# Patient Record
Sex: Female | Born: 1985 | Race: White | Hispanic: No | Marital: Married | Smoking: Never smoker
Health system: Southern US, Community
[De-identification: ages and names within clinical notes are randomized; demographics above are authoritative.]

## PROBLEM LIST (undated history)

## (undated) ENCOUNTER — Inpatient Hospital Stay (HOSPITAL_COMMUNITY): Payer: Self-pay

## (undated) DIAGNOSIS — Z789 Other specified health status: Secondary | ICD-10-CM

## (undated) HISTORY — PX: ANTERIOR CRUCIATE LIGAMENT REPAIR: SHX115

## (undated) HISTORY — PX: TONSILLECTOMY AND ADENOIDECTOMY: SUR1326

## (undated) HISTORY — PX: WRIST SURGERY: SHX841

## (undated) HISTORY — PX: TYMPANOSTOMY TUBE PLACEMENT: SHX32

---

## 2016-03-03 LAB — OB RESULTS CONSOLE RUBELLA ANTIBODY, IGM: Rubella: IMMUNE

## 2016-03-03 LAB — OB RESULTS CONSOLE HIV ANTIBODY (ROUTINE TESTING): HIV: NONREACTIVE

## 2016-03-03 LAB — OB RESULTS CONSOLE GC/CHLAMYDIA
Chlamydia: NEGATIVE
Gonorrhea: NEGATIVE

## 2016-03-03 LAB — OB RESULTS CONSOLE HEPATITIS B SURFACE ANTIGEN: Hepatitis B Surface Ag: NEGATIVE

## 2016-03-03 LAB — OB RESULTS CONSOLE ABO/RH: RH TYPE: POSITIVE

## 2016-03-03 LAB — OB RESULTS CONSOLE GBS: STREP GROUP B AG: POSITIVE

## 2016-03-03 LAB — OB RESULTS CONSOLE RPR: RPR: NONREACTIVE

## 2016-03-03 LAB — OB RESULTS CONSOLE ANTIBODY SCREEN: Antibody Screen: NEGATIVE

## 2016-05-13 ENCOUNTER — Other Ambulatory Visit: Payer: Self-pay | Admitting: Obstetrics and Gynecology

## 2016-05-21 ENCOUNTER — Other Ambulatory Visit (HOSPITAL_COMMUNITY): Payer: Self-pay | Admitting: Obstetrics and Gynecology

## 2016-05-21 DIAGNOSIS — Z3A21 21 weeks gestation of pregnancy: Secondary | ICD-10-CM

## 2016-05-21 DIAGNOSIS — Z3689 Encounter for other specified antenatal screening: Secondary | ICD-10-CM

## 2016-05-21 DIAGNOSIS — O280 Abnormal hematological finding on antenatal screening of mother: Secondary | ICD-10-CM

## 2016-05-27 ENCOUNTER — Encounter (HOSPITAL_COMMUNITY): Payer: Self-pay | Admitting: *Deleted

## 2016-05-28 ENCOUNTER — Encounter (HOSPITAL_COMMUNITY): Payer: Self-pay

## 2016-05-28 ENCOUNTER — Ambulatory Visit (HOSPITAL_COMMUNITY)
Admission: RE | Admit: 2016-05-28 | Discharge: 2016-05-28 | Disposition: A | Discharge status: Home / Self Care | Payer: Managed Care, Other (non HMO) | Source: Ambulatory Visit | Attending: Obstetrics and Gynecology | Admitting: Obstetrics and Gynecology

## 2016-05-28 ENCOUNTER — Other Ambulatory Visit (HOSPITAL_COMMUNITY): Payer: Self-pay | Admitting: *Deleted

## 2016-05-28 ENCOUNTER — Other Ambulatory Visit: Payer: Self-pay

## 2016-05-28 DIAGNOSIS — Z3A20 20 weeks gestation of pregnancy: Secondary | ICD-10-CM | POA: Insufficient documentation

## 2016-05-28 DIAGNOSIS — Z3A21 21 weeks gestation of pregnancy: Secondary | ICD-10-CM

## 2016-05-28 DIAGNOSIS — O280 Abnormal hematological finding on antenatal screening of mother: Secondary | ICD-10-CM

## 2016-05-28 DIAGNOSIS — Z3689 Encounter for other specified antenatal screening: Secondary | ICD-10-CM

## 2016-05-28 DIAGNOSIS — Z361 Encounter for antenatal screening for raised alphafetoprotein level: Secondary | ICD-10-CM | POA: Insufficient documentation

## 2016-05-28 DIAGNOSIS — Z363 Encounter for antenatal screening for malformations: Secondary | ICD-10-CM | POA: Diagnosis not present

## 2016-05-28 HISTORY — DX: Other specified health status: Z78.9

## 2016-05-28 NOTE — Progress Notes (Signed)
Genetic Counseling  High-Risk Gestation Note  Appointment Date:  05/28/2016 Referred By: Samantha Mora, Richard, MD Date of Birth:  05/31/1985 Partner:  Samantha Mora   Pregnancy History: G1P0 Estimated Date of Delivery: 10/15/16 Estimated Gestational Age: 7011w0d Attending: Alpha GulaPaul Whitecar, MD    Samantha Mora and her husband, Samantha Mora, were seen for consultation for genetic counseling because of an elevated MSAFP of 3.54 MoMs based on maternal serum screening through [facility].    In summary:  Discussed elevated MSAFP (3.54 MoM) and associated 1 in 34 OSBR  Reviewed possible explanations for elevation  Discussed additional options  Ultrasound- within normal limits today  Amniocentesis- declined  Discussed associations with unexplained elevated MSAFP  Follow-up ultrasound scheduled 07/09/16 to reassess fetal growth  Reviewed family history concerns  Discussed carrier screening options - declined  CF  SMA  Hemoglobinopathies  We reviewed [name]' maternal serum screening result, the elevation of MSAFP, and the associated 1 in 34 risk for a fetal open neural tube defect.   We reviewed open neural tube defects including: the typical multifactorial etiology and variable prognosis.  In addition, we discussed alternative explanations for an elevated MSAFP including: normal variation, twins, feto-maternal bleeding, a gestational dating error, abdominal wall defects, kidney differences, oligohydramnios, and placental problems.  We discussed that an unexplained elevation of MSAFP is associated with an increased risk for third trimester complications including: prematurity, low birth weight, and pre-eclampsia.    We reviewed additional available screening and diagnostic options including detailed ultrasound and amniocentesis.  We discussed the risks, limitations, and benefits of each. Detailed ultrasound was performed today. Visualized fetal  anatomy was within normal limits, and normal amniotic fluid volume visualized. Complete ultrasound report under separate cover. After thoughtful consideration of these options, Samantha Mora declined amniocentesis.  She understands that ultrasound cannot rule out all birth defects or genetic syndromes.  However, she was counseled that approximately 90% of fetuses with open neural tube defects can be detected by detailed second trimester ultrasound, when well visualized. Given the apparently unexplained elevated MSAFP, follow-up ultrasound was scheduled in 6 weeks to reassess fetal growth. Samantha Mora planned to discuss with her OB at her next visit whether this follow-up ultrasound could be performed in her OB office.  [name] was provided with written information regarding cystic fibrosis (CF), spinal muscular atrophy (SMA) and hemoglobinopathies including the carrier frequency, availability of carrier screening and prenatal diagnosis if indicated.  In addition, we discussed that CF and hemoglobinopathies are routinely screened for as part of the [place] newborn screening panel. She declined screening for CF, SMA and hemoglobinopathies.  Both family histories were reviewed and found to be noncontributory for birth defects, intellectual disability, and known genetic conditions. Consanguinity was denied. Formal pedigree construction was not performed today due to time constraints. Without further information regarding the provided family history, an accurate genetic risk cannot be calculated. Further genetic counseling is warranted if more information is obtained.  [name] denied exposure to environmental toxins or chemical agents. She denied the use of alcohol, tobacco or street drugs. She denied significant viral illnesses during the course of her pregnancy. Her medical and surgical histories were noncontributory.   I counseled this couple for approximately 25 minutes  regarding the above risks and available options.    Samantha PlowmanKaren Onesha Krebbs, MS,  Certified Genetic Counselor 05/28/2016

## 2016-06-01 ENCOUNTER — Ambulatory Visit (HOSPITAL_COMMUNITY): Payer: Managed Care, Other (non HMO)

## 2016-06-04 ENCOUNTER — Ambulatory Visit (HOSPITAL_COMMUNITY): Payer: Managed Care, Other (non HMO)

## 2016-06-13 ENCOUNTER — Inpatient Hospital Stay (HOSPITAL_COMMUNITY): Payer: Managed Care, Other (non HMO)

## 2016-06-13 ENCOUNTER — Inpatient Hospital Stay (HOSPITAL_COMMUNITY)
Admission: AD | Admit: 2016-06-13 | Discharge: 2016-06-13 | Disposition: A | Discharge status: Home / Self Care | Payer: Managed Care, Other (non HMO) | Source: Ambulatory Visit | Attending: Obstetrics and Gynecology | Admitting: Obstetrics and Gynecology

## 2016-06-13 ENCOUNTER — Encounter (HOSPITAL_COMMUNITY): Payer: Self-pay | Admitting: *Deleted

## 2016-06-13 DIAGNOSIS — Z79899 Other long term (current) drug therapy: Secondary | ICD-10-CM | POA: Insufficient documentation

## 2016-06-13 DIAGNOSIS — W010XXA Fall on same level from slipping, tripping and stumbling without subsequent striking against object, initial encounter: Secondary | ICD-10-CM | POA: Insufficient documentation

## 2016-06-13 DIAGNOSIS — Z9889 Other specified postprocedural states: Secondary | ICD-10-CM | POA: Insufficient documentation

## 2016-06-13 DIAGNOSIS — S8002XA Contusion of left knee, initial encounter: Secondary | ICD-10-CM

## 2016-06-13 DIAGNOSIS — R1032 Left lower quadrant pain: Secondary | ICD-10-CM | POA: Diagnosis not present

## 2016-06-13 DIAGNOSIS — O9A212 Injury, poisoning and certain other consequences of external causes complicating pregnancy, second trimester: Secondary | ICD-10-CM

## 2016-06-13 DIAGNOSIS — R112 Nausea with vomiting, unspecified: Secondary | ICD-10-CM

## 2016-06-13 DIAGNOSIS — W101XXA Fall (on)(from) sidewalk curb, initial encounter: Secondary | ICD-10-CM

## 2016-06-13 DIAGNOSIS — T1490XA Injury, unspecified, initial encounter: Secondary | ICD-10-CM | POA: Insufficient documentation

## 2016-06-13 DIAGNOSIS — S3991XA Unspecified injury of abdomen, initial encounter: Secondary | ICD-10-CM

## 2016-06-13 DIAGNOSIS — O26892 Other specified pregnancy related conditions, second trimester: Secondary | ICD-10-CM | POA: Insufficient documentation

## 2016-06-13 DIAGNOSIS — Z3A22 22 weeks gestation of pregnancy: Secondary | ICD-10-CM | POA: Diagnosis present

## 2016-06-13 LAB — URINALYSIS, ROUTINE W REFLEX MICROSCOPIC
Bilirubin Urine: NEGATIVE
GLUCOSE, UA: NEGATIVE mg/dL
Hgb urine dipstick: NEGATIVE
KETONES UR: NEGATIVE mg/dL
LEUKOCYTES UA: NEGATIVE
NITRITE: NEGATIVE
Protein, ur: NEGATIVE mg/dL
Specific Gravity, Urine: 1.009 (ref 1.005–1.030)
pH: 7 (ref 5.0–8.0)

## 2016-06-13 MED ORDER — ACETAMINOPHEN 500 MG PO TABS
1000.0000 mg | ORAL_TABLET | Freq: Once | ORAL | Status: AC
  Administered 2016-06-13: 1000 mg via ORAL
  Filled 2016-06-13: qty 2

## 2016-06-13 NOTE — MAU Note (Signed)
Pt states she fell last night between 2100 and 2200.  Pt states that she landed on her belly.  Pt states she is feeling the baby move this morning.  Pt states she is having some cramping on the lower left side of her abdomen.  Pt denies any bleeding or leaking.

## 2016-06-13 NOTE — Discharge Instructions (Signed)
What Do I Need to Know About Injuries During Pregnancy? °Trauma is the most common cause of injury and death in pregnant women. This can also result in significant harm or death of the baby. °Your baby is protected in the womb (uterus) by a sac filled with fluid (amniotic sac). Your baby can be harmed if there is direct, high-impact trauma to your abdomen and pelvis. This type of trauma can result in tearing of your uterus, the placenta pulling away from the wall of the uterus (placenta abruption), or the amniotic sac breaking open (rupture of membranes). These injuries can decrease or stop the blood supply to your baby or cause you to go into labor earlier than expected. Minor falls and low-impact automobile accidents do not usually harm your baby, even if they do minimally harm you. °WHAT KIND OF INJURIES CAN AFFECT MY PREGNANCY? °The most common causes of injury or death to a baby include: °· Falls. Falls are more common in the second and third trimester of the pregnancy. Factors that increase your risk of falling include: °¨ Increase in your weight. °¨ The change in your center of gravity. °¨ Tripping over an object that cannot be seen. °¨ Increased looseness (laxity) of your ligaments resulting in less coordinated movements (you may feel clumsy). °¨ Falling during high-risk activities like horseback riding or skiing. °· Automobile accidents. It is important to wear your seat belt properly, with the lap belt below your abdomen, and always practice safe driving. °· Domestic violence or assault. °· Burns (fire or electrical). °The most common causes of injury or death to the pregnant woman include: °· Injuries that cause severe bleeding, shock, and loss of blood flow to major organs. °· Head and neck injuries that result in severe brain or spinal damage. °· Chest trauma that can cause direct injury to the heart and lungs or any injury that affects the area enclosed by the ribs. Trauma to this area can result in  cardiorespiratory arrest. °WHAT CAN I DO TO PROTECT MYSELF AND MY BABY FROM INJURY WHILE I AM PREGNANT? °· Remove slippery rugs and loose objects on the floor that increase your risk of tripping. °· Avoid walking on wet or slippery floors. °· Wear comfortable shoes that have a good grip on the sole. Do not wear high-heeled shoes. °· Always wear your seat belt properly, with the lap belt below your abdomen, and always practice safe driving. Do not ride on a motorcycle while pregnant. °· Do not participate in high-impact activities or sports. °· Avoid fires, starting fires, lifting heavy pots of boiling or hot liquids, and fixing electrical problems. °· Only take over-the-counter or prescription medicines for pain, fever, or discomfort as directed by your health care provider. °· Know your blood type and the father's blood type in case you develop vaginal bleeding or experience an injury for which a blood transfusion may be necessary. °· Call your local emergency services (911 in the [place] if you are a victim of domestic violence or assault. Spousal abuse can be a significant cause of trauma during pregnancy. For help and support, contact the National Domestic Violence Hotline. °WHEN SHOULD I SEEK IMMEDIATE MEDICAL CARE?  °· You fall on your abdomen or experience any high-force accident or injury. °· You have been assaulted (domestic or otherwise). °· You have been in a car accident. °· You develop vaginal bleeding. °· You develop fluid leaking from the vagina. °· You develop uterine contractions (pelvic cramping, pain, or significant low back   pain). °· You become weak or faint, or have uncontrolled vomiting after trauma. °· You had a serious burn. This includes burns to the face, neck, hands, or genitals, or burns greater than the size of your palm anywhere else. °· You develop neck stiffness or pain after a fall or from other trauma. °· You develop a headache or vision problems after a fall or from other  trauma. °· You do not feel the baby moving or the baby is not moving as much as before a fall or other trauma. °This information is not intended to replace advice given to you by your health care provider. Make sure you discuss any questions you have with your health care provider. °Document Released: 06/17/2004 Document Revised: 05/31/2014 Document Reviewed: 02/14/2013 °Elsevier Interactive Patient Education © 2017 [employer] ° °

## 2016-06-13 NOTE — MAU Provider Note (Signed)
History     CSN: [id number]  Arrival date and time: 06/13/16 [id number]   First Provider Initiated Contact with Patient 06/13/16 1025      Chief Complaint  Patient presents with  . Fall   HPI Samantha Mora is a 31 y.o. G1P0 at 7858w2d who presents s/p fall. Fall occurred last night around 9 pm.  Patient tripped on her sidewalk, landing on her abdomen & left knee. Reports LLQ pain since just PTA. Pain is constant & describes as "like round ligament pain". Rates pain 3/10. Has not treated. Reports episode of vomiting last night after that fall that she relates to being "upset & worked up" because of falling. No nausea since then. Took dose of tylenol last night for knee pain. Denies knee pain at this time. Denies vaginal bleeding or LOF. Positive fetal movement.   OB History    Gravida Para Term Preterm AB Living   1         0   SAB TAB Ectopic Multiple Live Births                  Past Medical History:  Diagnosis Date  . Medical history non-contributory     Past Surgical History:  Procedure Laterality Date  . ANTERIOR CRUCIATE LIGAMENT REPAIR    . TONSILLECTOMY AND ADENOIDECTOMY    . TYMPANOSTOMY TUBE PLACEMENT    . WRIST SURGERY      History reviewed. No pertinent family history.  Social History  Substance Use Topics  . Smoking status: Never Smoker  . Smokeless tobacco: Never Used  . Alcohol use No    Allergies: No Known Allergies  Prescriptions Prior to Admission  Medication Sig Dispense Refill Last Dose  . Prenatal Vit-Fe Fumarate-FA (PRENATAL VITAMIN PO) Take 1 tablet by mouth daily.    06/12/2016    Review of Systems  Gastrointestinal: Positive for abdominal pain, nausea and vomiting. Negative for constipation and diarrhea.  Musculoskeletal:       + fall + knee pain  Neurological: Negative for dizziness and syncope.   Physical Exam   Blood pressure 112/69, pulse 97, temperature 97.7 F (36.5 C), temperature source Oral, resp. rate 18, height 5\' 3"  (1.6  m), weight 174 lb 3.2 oz (79 kg), SpO2 98 %.  Physical Exam  Nursing note and vitals reviewed. Constitutional: She is oriented to person, place, and time. She appears well-developed and well-nourished. No distress.  HENT:  Head: Normocephalic and atraumatic.  Eyes: Conjunctivae are normal. Right eye exhibits no discharge. Left eye exhibits no discharge. No scleral icterus.  Neck: Normal range of motion.  Cardiovascular: Normal rate, regular rhythm and normal heart sounds.   No murmur heard. Respiratory: Effort normal and breath sounds normal. No respiratory distress. She has no wheezes.  GI: Soft. Bowel sounds are normal. There is no tenderness.  Musculoskeletal:       Left knee: She exhibits normal range of motion, no swelling, no deformity, no laceration and normal patellar mobility.  Small contusion medial to patella of left leg, ~3 cm  Neurological: She is alert and oriented to person, place, and time.  Skin: Skin is warm and dry. She is not diaphoretic.  Psychiatric: She has a normal mood and affect. Her behavior is normal. Judgment and thought content normal.   TOCO: no contractions  Dilation: Closed Exam by:: Judeth HornErin Madinah Quarry, NP  MAU Course  Procedures Results for orders placed or performed during the hospital encounter of 06/13/16 (from the past  24 hour(s))  Urinalysis, Routine w reflex microscopic     Status: Abnormal   Collection Time: 06/13/16  9:35 AM  Result Value Ref Range   Color, Urine YELLOW YELLOW   APPearance HAZY (A) CLEAR   Specific Gravity, Urine 1.009 1.005 - 1.030   pH 7.0 5.0 - 8.0   Glucose, UA NEGATIVE NEGATIVE mg/dL   Hgb urine dipstick NEGATIVE NEGATIVE   Bilirubin Urine NEGATIVE NEGATIVE   Ketones, ur NEGATIVE NEGATIVE mg/dL   Protein, ur NEGATIVE NEGATIVE mg/dL   Nitrite NEGATIVE NEGATIVE   Leukocytes, UA NEGATIVE NEGATIVE    MDM FHT 130 by doppler AB positive blood type per prenatal record Tylenol 1 gm PO -- pain resolved Cervix closed; no  ctx palpated or appreciated on TOCO Ultrasound ordered d/t direct abdominal trauma S/w Dr. Henderson Cloud. Ok to discharge home Assessment and Plan  A:  1. [redacted] weeks gestation of pregnancy   2. Abdominal trauma, initial encounter   3. Fall (on)(from) sidewalk curb, initial encounter     P: Discharge home Discussed reasons to return to MAU Keep f/u with OB  Judeth Horn 06/13/2016, 10:08 AM

## 2016-07-09 ENCOUNTER — Ambulatory Visit (HOSPITAL_COMMUNITY): Payer: Managed Care, Other (non HMO)

## 2016-07-09 ENCOUNTER — Encounter (HOSPITAL_COMMUNITY): Payer: Self-pay

## 2016-10-03 NOTE — H&P (Signed)
Samantha Mora is a 31 y.o. female presenting for elective primary cesarean section.  Antepartum course complicated by elevated msAFP; s/p MFM consult and normal serial ultrasound findings.  GBS positive.  OB History    Gravida Para Term Preterm AB Living   1         0   SAB TAB Ectopic Multiple Live Births                 Past Medical History:  Diagnosis Date  . Medical history non-contributory    Past Surgical History:  Procedure Laterality Date  . ANTERIOR CRUCIATE LIGAMENT REPAIR    . TONSILLECTOMY AND ADENOIDECTOMY    . TYMPANOSTOMY TUBE PLACEMENT    . WRIST SURGERY     Family History: family history is not on file. Social History:  reports that she has never smoked. She has never used smokeless tobacco. She reports that she does not drink alcohol or use drugs.     Maternal Diabetes: No Genetic Screening: Abnormal:  Results: Elevated AFP Maternal Ultrasounds/Referrals: Normal Fetal Ultrasounds or other Referrals:  Referred to Materal Fetal Medicine  Maternal Substance Abuse:  No Significant Maternal Medications:  None Significant Maternal Lab Results:  Lab values include: Group B Strep positive Other Comments:  None  ROS Maternal Medical History:  Prenatal complications: no prenatal complications Prenatal Complications - Diabetes: none.      There were no vitals taken for this visit. Maternal Exam:  Abdomen: Patient reports no abdominal tenderness. Fundal height is S>D.   Estimated fetal weight is 9#.       Physical Exam  Constitutional: She is oriented to person, place, and time. She appears well-developed and well-nourished.  GI: Soft. There is no rebound and no guarding.  Neurological: She is alert and oriented to person, place, and time.  Skin: Skin is warm and dry.  Psychiatric: She has a normal mood and affect. Her behavior is normal.    Prenatal labs: ABO, Rh:   Antibody:   Rubella:   RPR:    HBsAg:    HIV:    GBS:      Assessment/Plan: 31yo G1 at 39w for primary elective cesarean section -The patient is counseled re: risk of bleeding, infection, scarring and damage to surrounding structures.  All questions were answered and the patient wishes to proceed.  Iran Rowe 10/03/2016, 9:44 PM

## 2016-10-04 ENCOUNTER — Encounter (HOSPITAL_COMMUNITY): Payer: Self-pay

## 2016-10-04 ENCOUNTER — Telehealth (HOSPITAL_COMMUNITY): Payer: Self-pay | Admitting: *Deleted

## 2016-10-04 NOTE — Telephone Encounter (Signed)
Preadmission screen  

## 2016-10-05 ENCOUNTER — Encounter (HOSPITAL_COMMUNITY): Payer: Self-pay

## 2016-10-06 NOTE — Patient Instructions (Signed)
[address]  10/06/2016   Your procedure is scheduled on:  10/08/2016  [facility] at 0530 AM.  Pick up the phone at the desk and dial [phone].   Call this number if you have problems the morning of surgery: [phone]   Remember:   Do not eat food:After Midnight.  Do not drink clear liquids: After Midnight.  Take these medicines the morning of surgery with A SIP OF WATER: none   Do not wear jewelry, make-up or nail polish.  Do not wear lotions, powders, or perfumes. Do not wear deodorant.  Do not shave 48 hours prior to surgery.  Do not bring valuables to the hospital.  [facility] is not   responsible for any belongings or valuables brought to the hospital.  Contacts, dentures or bridgework may not be worn into surgery.  Leave suitcase in the car. After surgery it may be brought to your room.  For patients admitted to the hospital, checkout time is 11:00 AM the day of              discharge.   Patients discharged the day of surgery will not be allowed to drive             home.  Name and phone number of your driver: *na  Special Instructions:   N/A   Please read over the following fact sheets that you were given:   Surgical Site Infection Prevention

## 2016-10-07 ENCOUNTER — Encounter (HOSPITAL_COMMUNITY)
Admission: RE | Admit: 2016-10-07 | Discharge: 2016-10-07 | Disposition: A | Discharge status: Home / Self Care | Payer: 59 | Source: Ambulatory Visit | Attending: Obstetrics & Gynecology | Admitting: Obstetrics & Gynecology

## 2016-10-07 LAB — TYPE AND SCREEN
ABO/RH(D): AB POS
Antibody Screen: NEGATIVE

## 2016-10-07 LAB — CBC
HEMATOCRIT: 36.8 % (ref 36.0–46.0)
Hemoglobin: 12.5 g/dL (ref 12.0–15.0)
MCH: 28.2 pg (ref 26.0–34.0)
MCHC: 34 g/dL (ref 30.0–36.0)
MCV: 82.9 fL (ref 78.0–100.0)
PLATELETS: 223 10*3/uL (ref 150–400)
RBC: 4.44 MIL/uL (ref 3.87–5.11)
RDW: 15.3 % (ref 11.5–15.5)
WBC: 8.1 10*3/uL (ref 4.0–10.5)

## 2016-10-07 LAB — ABO/RH: ABO/RH(D): AB POS

## 2016-10-07 NOTE — Progress Notes (Signed)
Pt given pre op instructions and CHG bath. Pt verbalizes understanding. Lab work drawn.

## 2016-10-08 ENCOUNTER — Encounter (HOSPITAL_COMMUNITY): Payer: Self-pay | Admitting: *Deleted

## 2016-10-08 ENCOUNTER — Inpatient Hospital Stay (HOSPITAL_COMMUNITY)
Admission: RE | Admit: 2016-10-08 | Discharge: 2016-10-11 | DRG: 766 | Disposition: A | Discharge status: Home / Self Care | Payer: 59 | Source: Ambulatory Visit | Attending: Obstetrics & Gynecology | Admitting: Obstetrics & Gynecology

## 2016-10-08 ENCOUNTER — Inpatient Hospital Stay (HOSPITAL_COMMUNITY): Payer: 59 | Admitting: Anesthesiology

## 2016-10-08 ENCOUNTER — Encounter (HOSPITAL_COMMUNITY)
Admission: RE | Disposition: A | Discharge status: Home / Self Care | Payer: Self-pay | Source: Ambulatory Visit | Attending: Obstetrics & Gynecology

## 2016-10-08 DIAGNOSIS — Z3493 Encounter for supervision of normal pregnancy, unspecified, third trimester: Secondary | ICD-10-CM | POA: Diagnosis present

## 2016-10-08 DIAGNOSIS — O99824 Streptococcus B carrier state complicating childbirth: Secondary | ICD-10-CM | POA: Diagnosis present

## 2016-10-08 DIAGNOSIS — Z3A39 39 weeks gestation of pregnancy: Secondary | ICD-10-CM

## 2016-10-08 DIAGNOSIS — O9989 Other specified diseases and conditions complicating pregnancy, childbirth and the puerperium: Secondary | ICD-10-CM | POA: Diagnosis present

## 2016-10-08 DIAGNOSIS — Z98891 History of uterine scar from previous surgery: Secondary | ICD-10-CM

## 2016-10-08 DIAGNOSIS — O280 Abnormal hematological finding on antenatal screening of mother: Secondary | ICD-10-CM

## 2016-10-08 LAB — RPR: RPR: NONREACTIVE

## 2016-10-08 SURGERY — Surgical Case
Anesthesia: Spinal

## 2016-10-08 MED ORDER — LACTATED RINGERS IV SOLN
INTRAVENOUS | Status: DC
Start: 2016-10-08 — End: 2016-10-08

## 2016-10-08 MED ORDER — NALBUPHINE HCL 10 MG/ML IJ SOLN: 5.0000 mg | Freq: Once | INTRAMUSCULAR | Status: DC | PRN

## 2016-10-08 MED ORDER — SCOPOLAMINE 1 MG/3DAYS TD PT72
1.0000 | MEDICATED_PATCH | Freq: Once | TRANSDERMAL | Status: DC
  Administered 2016-10-08: 1.5 mg via TRANSDERMAL
  Filled 2016-10-08: qty 1

## 2016-10-08 MED ORDER — FENTANYL CITRATE (PF) 100 MCG/2ML IJ SOLN: 25.0000 ug | INTRAMUSCULAR | Status: DC | PRN

## 2016-10-08 MED ORDER — ACETAMINOPHEN 325 MG PO TABS
650.0000 mg | ORAL_TABLET | ORAL | Status: DC | PRN
  Administered 2016-10-09 – 2016-10-11 (×7): 650 mg via ORAL
  Filled 2016-10-08 (×9): qty 2

## 2016-10-08 MED ORDER — MORPHINE SULFATE (PF) 0.5 MG/ML IJ SOLN
INTRAMUSCULAR | Status: DC | PRN
  Administered 2016-10-08: .2 mg via INTRATHECAL

## 2016-10-08 MED ORDER — LACTATED RINGERS IV SOLN
INTRAVENOUS | Status: DC
  Administered 2016-10-08 (×2): via INTRAVENOUS

## 2016-10-08 MED ORDER — ONDANSETRON HCL 4 MG/2ML IJ SOLN
INTRAMUSCULAR | Status: DC | PRN
  Administered 2016-10-08: 4 mg via INTRAVENOUS

## 2016-10-08 MED ORDER — ONDANSETRON HCL 4 MG/2ML IJ SOLN
INTRAMUSCULAR | Status: AC
  Filled 2016-10-08: qty 2

## 2016-10-08 MED ORDER — SIMETHICONE 80 MG PO CHEW
80.0000 mg | CHEWABLE_TABLET | ORAL | Status: DC
  Administered 2016-10-08 – 2016-10-10 (×3): 80 mg via ORAL
  Filled 2016-10-08 (×5): qty 1

## 2016-10-08 MED ORDER — BUPIVACAINE IN DEXTROSE 0.75-8.25 % IT SOLN
INTRATHECAL | Status: DC | PRN
  Administered 2016-10-08: 1.3 mL via INTRATHECAL

## 2016-10-08 MED ORDER — KETOROLAC TROMETHAMINE 30 MG/ML IJ SOLN: 30.0000 mg | Freq: Four times a day (QID) | INTRAMUSCULAR | Status: AC | PRN

## 2016-10-08 MED ORDER — OXYTOCIN 40 UNITS IN LACTATED RINGERS INFUSION - SIMPLE MED: 2.5000 [IU]/h | INTRAVENOUS | Status: AC

## 2016-10-08 MED ORDER — NALOXONE HCL 2 MG/2ML IJ SOSY
1.0000 ug/kg/h | PREFILLED_SYRINGE | INTRAVENOUS | Status: DC | PRN
  Filled 2016-10-08: qty 2

## 2016-10-08 MED ORDER — METOCLOPRAMIDE HCL 5 MG/ML IJ SOLN: 10.0000 mg | Freq: Once | INTRAMUSCULAR | Status: DC | PRN

## 2016-10-08 MED ORDER — FENTANYL CITRATE (PF) 100 MCG/2ML IJ SOLN
INTRAMUSCULAR | Status: AC
  Filled 2016-10-08: qty 2

## 2016-10-08 MED ORDER — NALBUPHINE HCL 10 MG/ML IJ SOLN
INTRAMUSCULAR | Status: AC
  Filled 2016-10-08: qty 1

## 2016-10-08 MED ORDER — PRENATAL MULTIVITAMIN CH
1.0000 | ORAL_TABLET | Freq: Every day | ORAL | Status: DC
  Administered 2016-10-09 – 2016-10-10 (×2): 1 via ORAL
  Filled 2016-10-08 (×4): qty 1

## 2016-10-08 MED ORDER — DEXAMETHASONE SODIUM PHOSPHATE 4 MG/ML IJ SOLN
INTRAMUSCULAR | Status: DC | PRN
  Administered 2016-10-08: 4 mg via INTRAVENOUS

## 2016-10-08 MED ORDER — DIPHENHYDRAMINE HCL 25 MG PO CAPS
25.0000 mg | ORAL_CAPSULE | Freq: Four times a day (QID) | ORAL | Status: DC | PRN
  Filled 2016-10-08: qty 1

## 2016-10-08 MED ORDER — SOD CITRATE-CITRIC ACID 500-334 MG/5ML PO SOLN
30.0000 mL | Freq: Once | ORAL | Status: AC
  Administered 2016-10-08: 30 mL via ORAL
  Filled 2016-10-08: qty 15

## 2016-10-08 MED ORDER — MORPHINE SULFATE (PF) 0.5 MG/ML IJ SOLN
INTRAMUSCULAR | Status: AC
  Filled 2016-10-08: qty 10

## 2016-10-08 MED ORDER — MEPERIDINE HCL 25 MG/ML IJ SOLN: 6.2500 mg | INTRAMUSCULAR | Status: DC | PRN

## 2016-10-08 MED ORDER — LACTATED RINGERS IV SOLN
INTRAVENOUS | Status: DC
  Administered 2016-10-08: 21:00:00 via INTRAVENOUS

## 2016-10-08 MED ORDER — OXYCODONE-ACETAMINOPHEN 5-325 MG PO TABS: 2.0000 | ORAL_TABLET | ORAL | Status: DC | PRN

## 2016-10-08 MED ORDER — LACTATED RINGERS IV SOLN: INTRAVENOUS | Status: DC

## 2016-10-08 MED ORDER — DIPHENHYDRAMINE HCL 50 MG/ML IJ SOLN
12.5000 mg | INTRAMUSCULAR | Status: DC | PRN
Start: 2016-10-08 — End: 2016-10-11

## 2016-10-08 MED ORDER — SODIUM CHLORIDE 0.9% FLUSH: 3.0000 mL | INTRAVENOUS | Status: DC | PRN

## 2016-10-08 MED ORDER — PHENYLEPHRINE HCL 10 MG/ML IJ SOLN
INTRAMUSCULAR | Status: DC | PRN
  Administered 2016-10-08 (×2): 80 ug via INTRAVENOUS

## 2016-10-08 MED ORDER — PHENYLEPHRINE 8 MG IN D5W 100 ML (0.08MG/ML) PREMIX OPTIME
INJECTION | INTRAVENOUS | Status: DC | PRN
  Administered 2016-10-08: 60 ug/min via INTRAVENOUS

## 2016-10-08 MED ORDER — NALOXONE HCL 0.4 MG/ML IJ SOLN: 0.4000 mg | INTRAMUSCULAR | Status: DC | PRN

## 2016-10-08 MED ORDER — FENTANYL CITRATE (PF) 100 MCG/2ML IJ SOLN
INTRAMUSCULAR | Status: DC | PRN
  Administered 2016-10-08: 12.5 ug via INTRATHECAL

## 2016-10-08 MED ORDER — NALBUPHINE HCL 10 MG/ML IJ SOLN: 5.0000 mg | INTRAMUSCULAR | Status: DC | PRN

## 2016-10-08 MED ORDER — WITCH HAZEL-GLYCERIN EX PADS: 1.0000 "application " | MEDICATED_PAD | CUTANEOUS | Status: DC | PRN

## 2016-10-08 MED ORDER — PHENYLEPHRINE 8 MG IN D5W 100 ML (0.08MG/ML) PREMIX OPTIME
INJECTION | INTRAVENOUS | Status: AC
  Filled 2016-10-08: qty 100

## 2016-10-08 MED ORDER — DEXAMETHASONE SODIUM PHOSPHATE 4 MG/ML IJ SOLN
INTRAMUSCULAR | Status: AC
  Filled 2016-10-08: qty 1

## 2016-10-08 MED ORDER — KETOROLAC TROMETHAMINE 30 MG/ML IJ SOLN
INTRAMUSCULAR | Status: AC
  Administered 2016-10-08: 30 mg
  Filled 2016-10-08: qty 1

## 2016-10-08 MED ORDER — ONDANSETRON HCL 4 MG/2ML IJ SOLN: 4.0000 mg | Freq: Three times a day (TID) | INTRAMUSCULAR | Status: DC | PRN

## 2016-10-08 MED ORDER — DIBUCAINE 1 % RE OINT: 1.0000 "application " | TOPICAL_OINTMENT | RECTAL | Status: DC | PRN

## 2016-10-08 MED ORDER — NALBUPHINE HCL 10 MG/ML IJ SOLN
5.0000 mg | INTRAMUSCULAR | Status: DC | PRN
  Administered 2016-10-08: 5 mg via SUBCUTANEOUS

## 2016-10-08 MED ORDER — DIPHENHYDRAMINE HCL 25 MG PO CAPS
25.0000 mg | ORAL_CAPSULE | ORAL | Status: DC | PRN
  Filled 2016-10-08: qty 1

## 2016-10-08 MED ORDER — OXYCODONE-ACETAMINOPHEN 5-325 MG PO TABS: 1.0000 | ORAL_TABLET | ORAL | Status: DC | PRN

## 2016-10-08 MED ORDER — SENNOSIDES-DOCUSATE SODIUM 8.6-50 MG PO TABS
2.0000 | ORAL_TABLET | ORAL | Status: DC
  Administered 2016-10-08 – 2016-10-10 (×3): 2 via ORAL
  Filled 2016-10-08 (×5): qty 2

## 2016-10-08 MED ORDER — IBUPROFEN 600 MG PO TABS
600.0000 mg | ORAL_TABLET | Freq: Four times a day (QID) | ORAL | Status: DC
  Administered 2016-10-08 – 2016-10-11 (×12): 600 mg via ORAL
  Filled 2016-10-08 (×12): qty 1

## 2016-10-08 MED ORDER — SIMETHICONE 80 MG PO CHEW
80.0000 mg | CHEWABLE_TABLET | Freq: Three times a day (TID) | ORAL | Status: DC
  Administered 2016-10-08 – 2016-10-10 (×7): 80 mg via ORAL
  Filled 2016-10-08 (×12): qty 1

## 2016-10-08 MED ORDER — OXYTOCIN 10 UNIT/ML IJ SOLN
INTRAMUSCULAR | Status: AC
  Filled 2016-10-08: qty 4

## 2016-10-08 MED ORDER — MENTHOL 3 MG MT LOZG: 1.0000 | LOZENGE | OROMUCOSAL | Status: DC | PRN

## 2016-10-08 MED ORDER — TETANUS-DIPHTH-ACELL PERTUSSIS 5-2.5-18.5 LF-MCG/0.5 IM SUSP: 0.5000 mL | Freq: Once | INTRAMUSCULAR | Status: DC

## 2016-10-08 MED ORDER — CEFAZOLIN SODIUM-DEXTROSE 2-4 GM/100ML-% IV SOLN
2.0000 g | INTRAVENOUS | Status: AC
  Administered 2016-10-08: 2 g via INTRAVENOUS

## 2016-10-08 MED ORDER — SIMETHICONE 80 MG PO CHEW
80.0000 mg | CHEWABLE_TABLET | ORAL | Status: DC | PRN
  Filled 2016-10-08: qty 1

## 2016-10-08 MED ORDER — OXYTOCIN 10 UNIT/ML IJ SOLN
INTRAVENOUS | Status: DC | PRN
  Administered 2016-10-08: 40 [IU] via INTRAVENOUS

## 2016-10-08 MED ORDER — COCONUT OIL OIL
1.0000 "application " | TOPICAL_OIL | Status: DC | PRN
  Administered 2016-10-09: 1 via TOPICAL
  Filled 2016-10-08: qty 120

## 2016-10-08 MED ORDER — ZOLPIDEM TARTRATE 5 MG PO TABS: 5.0000 mg | ORAL_TABLET | Freq: Every evening | ORAL | Status: DC | PRN

## 2016-10-08 SURGICAL SUPPLY — 32 items
BENZOIN TINCTURE PRP APPL 2/3 (GAUZE/BANDAGES/DRESSINGS) ×2 IMPLANT
CHLORAPREP W/TINT 26ML (MISCELLANEOUS) ×2 IMPLANT
CLAMP CORD UMBIL (MISCELLANEOUS) IMPLANT
CLOTH BEACON ORANGE TIMEOUT ST (SAFETY) ×2 IMPLANT
DERMABOND ADVANCED (GAUZE/BANDAGES/DRESSINGS)
DERMABOND ADVANCED .7 DNX12 (GAUZE/BANDAGES/DRESSINGS) IMPLANT
DRSG OPSITE POSTOP 4X10 (GAUZE/BANDAGES/DRESSINGS) ×2 IMPLANT
ELECT REM PT RETURN 9FT ADLT (ELECTROSURGICAL) ×2
ELECTRODE REM PT RTRN 9FT ADLT (ELECTROSURGICAL) ×1 IMPLANT
EXTRACTOR VACUUM KIWI (MISCELLANEOUS) IMPLANT
GLOVE BIO SURGEON STRL SZ 6 (GLOVE) ×2 IMPLANT
GLOVE BIOGEL PI IND STRL 6 (GLOVE) ×2 IMPLANT
GLOVE BIOGEL PI IND STRL 7.0 (GLOVE) ×1 IMPLANT
GLOVE BIOGEL PI INDICATOR 6 (GLOVE) ×2
GLOVE BIOGEL PI INDICATOR 7.0 (GLOVE) ×1
GOWN STRL REUS W/TWL LRG LVL3 (GOWN DISPOSABLE) ×4 IMPLANT
KIT ABG SYR 3ML LUER SLIP (SYRINGE) ×2 IMPLANT
NEEDLE HYPO 25X5/8 SAFETYGLIDE (NEEDLE) ×2 IMPLANT
NS IRRIG 1000ML POUR BTL (IV SOLUTION) ×2 IMPLANT
PACK C SECTION WH (CUSTOM PROCEDURE TRAY) ×2 IMPLANT
PAD OB MATERNITY 4.3X12.25 (PERSONAL CARE ITEMS) ×2 IMPLANT
PENCIL SMOKE EVAC W/HOLSTER (ELECTROSURGICAL) ×2 IMPLANT
STRIP CLOSURE SKIN 1/2X4 (GAUZE/BANDAGES/DRESSINGS) ×2 IMPLANT
SUT CHROMIC 0 CTX 36 (SUTURE) ×6 IMPLANT
SUT MON AB 2-0 CT1 27 (SUTURE) ×2 IMPLANT
SUT PDS AB 0 CT1 27 (SUTURE) IMPLANT
SUT PLAIN 0 NONE (SUTURE) IMPLANT
SUT VIC AB 0 CT1 36 (SUTURE) IMPLANT
SUT VIC AB 4-0 KS 27 (SUTURE) IMPLANT
TAPE STRIPS DRAPE STRL (GAUZE/BANDAGES/DRESSINGS) ×2 IMPLANT
TOWEL OR 17X24 6PK STRL BLUE (TOWEL DISPOSABLE) ×2 IMPLANT
TRAY FOLEY BAG SILVER LF 14FR (SET/KITS/TRAYS/PACK) IMPLANT

## 2016-10-08 NOTE — Transfer of Care (Signed)
Immediate Anesthesia Transfer of Care Note  Patient: Samantha Mora  Procedure(s) Performed: Procedure(s) with comments: CESAREAN SECTION (N/A) - NEED RNFA  - [facility]   Patient Location: PACU  Anesthesia Type:Spinal  Level of Consciousness: awake, alert  and oriented  Airway & Oxygen Therapy: Patient Spontanous Breathing  Post-op Assessment: Report given to RN and Post -op Vital signs reviewed and stable  Post vital signs: Reviewed and stable HR 92, RR 12, SaO2 97%, BP 119/65  Last Vitals:  Vitals:   10/08/16 0610  BP: 117/83  Pulse: (!) 103  Resp: 18  Temp: 36.5 C    Last Pain:  Vitals:   10/08/16 0610  TempSrc: Oral  PainSc: 3          Complications: No apparent anesthesia complications

## 2016-10-08 NOTE — Op Note (Signed)
Samantha Mora PROCEDURE DATE: 10/08/2016  PREOPERATIVE DIAGNOSIS: Intrauterine pregnancy at  6933w0d weeks gestation, maternal request for cesarean section  POSTOPERATIVE DIAGNOSIS: The same  PROCEDURE: Primary Elective Low Transverse Cesarean Section  SURGEON:  Dr. Mitchel HonourMegan Girolamo Lortie  INDICATIONS: Samantha Mora is a 31 y.o. G1P0 at 5133w0d scheduled for cesarean section secondary to maternal request.  The risks of cesarean section discussed with the patient included but were not limited to: bleeding which may require transfusion or reoperation; infection which may require antibiotics; injury to bowel, bladder, ureters or other surrounding organs; injury to the fetus; need for additional procedures including hysterectomy in the event of a life-threatening hemorrhage; placental abnormalities wth subsequent pregnancies, incisional problems, thromboembolic phenomenon and other postoperative/anesthesia complications. The patient concurred with the proposed plan, giving informed written consent for the procedure.    FINDINGS:  Viable female infant in cephalic presentation, APGARs 8,8: weight pending  Clear amniotic fluid.  Intact placenta, three vessel cord.  Grossly normal uterus, ovaries and fallopian tubes. .   ANESTHESIA:  Spinal ESTIMATED BLOOD LOSS: 700 ml SPECIMENS: Placenta sent to L&D COMPLICATIONS: None immediate  PROCEDURE IN DETAIL:  The patient received intravenous antibiotics and had sequential compression devices applied to her lower extremities while in the preoperative area.  She was then taken to the operating room where spinal anesthesia was administered and was found to be adequate. She was then placed in a dorsal supine position with a leftward tilt, and prepped and draped in a sterile manner.  A foley catheter was placed into her bladder and attached to constant gravity.  After an adequate timeout was performed, a Pfannenstiel skin incision was made with scalpel and carried  through to the underlying layer of fascia. The fascia was incised in the midline and this incision was extended bilaterally using the Mayo scissors. Kocher clamps were applied to the superior aspect of the fascial incision and the underlying rectus muscles were dissected off bluntly. A similar process was carried out on the inferior aspect of the facial incision. The rectus muscles were separated in the midline bluntly and the peritoneum was entered bluntly.  Bladder flap was created sharply and developed bluntly. Bladder blade was placed.  A transverse hysterotomy was made with a scalpel and extended bilaterally bluntly. The bladder blade was then removed. The infant was successfully delivered using a single Kiwi vacuum pull, and cord was clamped and cut and infant was handed over to awaiting neonatology team. Uterine massage was then administered and the placenta delivered intact with three-vessel cord. The uterus was cleared of clot and debris.  The hysterotomy was closed with 0 chromic.  A second imbricating suture of 0-chromic was used to reinforce the incision and aid in hemostasis.  The peritoneum and rectus muscles were noted to be hemostatic and were reapproximated using 2-0 monocryl in a running fashion.  The fascia was closed with 0-Vicryl in a running fashion with good restoration of anatomy.  The subcutaneus tissue was copiously irrigated.  The skin was closed with 4-0 vicryl in a subcuticular fashion.  Pt tolerated the procedure will.  All counts were correct x2.  Pt went to the recovery room in stable condition.

## 2016-10-08 NOTE — Anesthesia Procedure Notes (Signed)
Spinal  Patient location during procedure: OR Staffing Anesthesiologist: Issaac Shipper Performed: anesthesiologist  Preanesthetic Checklist Completed: patient identified, site marked, surgical consent, pre-op evaluation, timeout performed, IV checked, risks and benefits discussed and monitors and equipment checked Spinal Block Patient position: sitting Prep: DuraPrep Patient monitoring: heart rate, continuous pulse ox and blood pressure Approach: midline Location: L3-4 Injection technique: single-shot Needle Needle type: Sprotte  Needle gauge: 24 G Needle length: 9 cm Additional Notes Expiration date of kit checked and confirmed. Patient tolerated procedure well, without complications.       

## 2016-10-08 NOTE — Anesthesia Preprocedure Evaluation (Signed)
Anesthesia Evaluation  Patient identified by MRN, date of birth, ID band Patient awake    Reviewed: Allergy & Precautions, NPO status , Patient's Chart, lab work & pertinent test results  Airway Mallampati: I  TM Distance: >3 FB Neck ROM: Full    Dental no notable dental hx.    Pulmonary neg pulmonary ROS,    Pulmonary exam normal breath sounds clear to auscultation       Cardiovascular negative cardio ROS Normal cardiovascular exam Rhythm:Regular Rate:Normal     Neuro/Psych negative neurological ROS  negative psych ROS   GI/Hepatic negative GI ROS, Neg liver ROS,   Endo/Other  negative endocrine ROS  Renal/GU negative Renal ROS  negative genitourinary   Musculoskeletal negative musculoskeletal ROS (+)   Abdominal   Peds negative pediatric ROS (+)  Hematology negative hematology ROS (+)   Anesthesia Other Findings   Reproductive/Obstetrics (+) Pregnancy                             Anesthesia Physical Anesthesia Plan  ASA: II  Anesthesia Plan: Spinal   Post-op Pain Management:    Induction:   Airway Management Planned: Natural Airway  Additional Equipment:   Intra-op Plan:   Post-operative Plan:   Informed Consent: I have reviewed the patients History and Physical, chart, labs and discussed the procedure including the risks, benefits and alternatives for the proposed anesthesia with the patient or authorized representative who has indicated his/her understanding and acceptance.   Dental advisory given  Plan Discussed with: CRNA  Anesthesia Plan Comments:         Anesthesia Quick Evaluation

## 2016-10-08 NOTE — Progress Notes (Signed)
No change to H&P.  [place]opolis Sloop, DO 

## 2016-10-08 NOTE — Lactation Note (Signed)
This note was copied from a baby's chart. Lactation Consultation Note  Patient Name: Samantha Mora Yearick ONGEX'BToday's Date: 10/08/2016 Reason for consult: Initial assessment Baby at 5 hr of life. Mom is worried because baby was sleepy at the last bf attempt and did not latch. Mom reports having "a lot of milk". She stated she was leaking "for a few months" so she started pumping. It is unclear at this visit if she saved the expressed milk. Limited bf education was done because mom was feeling nauseous. Discussed baby behavior, feeding frequency, baby belly size, voids, and wt loss. Given lactation handouts. Aware of OP services and support group. Mom will call for lactation at the next bf.    Maternal Data Has patient been taught Hand Expression?: Yes Does the patient have breastfeeding experience prior to this delivery?: No  Feeding Feeding Type: Breast Fed  LATCH Score/Interventions                      Lactation Tools Discussed/Used WIC Program: No   Consult Status Consult Status: Follow-up Date: 10/09/16 Follow-up type: In-patient    Samantha Mora 10/08/2016, 1:37 PM

## 2016-10-09 LAB — CBC
HCT: 30.1 % — ABNORMAL LOW (ref 36.0–46.0)
Hemoglobin: 10.2 g/dL — ABNORMAL LOW (ref 12.0–15.0)
MCH: 28.6 pg (ref 26.0–34.0)
MCHC: 33.9 g/dL (ref 30.0–36.0)
MCV: 84.3 fL (ref 78.0–100.0)
Platelets: 199 K/uL (ref 150–400)
RBC: 3.57 MIL/uL — ABNORMAL LOW (ref 3.87–5.11)
RDW: 15.5 % (ref 11.5–15.5)
WBC: 10.8 K/uL — ABNORMAL HIGH (ref 4.0–10.5)

## 2016-10-09 LAB — BIRTH TISSUE RECOVERY COLLECTION (PLACENTA DONATION)

## 2016-10-09 NOTE — Anesthesia Postprocedure Evaluation (Signed)
Anesthesia Post Note  Patient: Samantha Mora  Procedure(s) Performed: Procedure(s) (LRB): CESAREAN SECTION (N/A)  Patient location during evaluation: Mother Baby Anesthesia Type: Spinal Level of consciousness: awake, awake and alert and oriented Pain management: pain level controlled Vital Signs Assessment: post-procedure vital signs reviewed and stable Respiratory status: spontaneous breathing and nonlabored ventilation Cardiovascular status: stable Postop Assessment: no headache, no backache, spinal receding, patient able to bend at knees, no signs of nausea or vomiting and adequate PO intake Anesthetic complications: no        Last Vitals:  Vitals:   10/08/16 2224 10/09/16 0603  BP: 100/66 (!) 93/57  Pulse: 71 71  Resp: 18 18  Temp: 36.8 C 36.7 C    Last Pain:  Vitals:   10/09/16 0936  TempSrc:   PainSc: 3    Pain Goal: Patients Stated Pain Goal: 3 (10/09/16 0936)               Laban EmperorMalinova,Gordon Carlson Hristova

## 2016-10-09 NOTE — Lactation Note (Signed)
This note was copied from a baby's chart. Lactation Consultation Note  Patient Name: Samantha Mora ZOXWR'UToday's Date: 10/09/2016 Reason for consult: Follow-up assessment;Breast/nipple pain  Baby 32 hrs old, 5.5% weight loss, 4 voids and 2 stools last 24 hrs.  Mom reporting on sore nipples, using coconut oil.  Bruise noted on left areola.  Nipples erect with short shafts.  Asked Mom if she has had assistance with positioning and latching.  Mom stated her cousin worked in the NICU at [facility], she looked at the latch and thought it looked good.  Mom stated she falls asleep on the breast a lot.  Offered to assist with a latch, and Mom agreed.   Baby noted to have some nasal stuffiness.  Mom positioning baby in football hold, leaning into baby.  She was also using a scissor hold with fingers on the areola.  Instructed Mom on benefits of sandwiching breast making sure hand is not close to areola so baby can latch widely and deeply onto breast.  Baby kept latching onto nipple with dimpling of cheeks noted, and popping on and off.  Mom resisted assistance initially, and complained that baby was "too high".  Pillow support removed per request.  Mom readjusted her hands, and baby was able to attain a deep areolar latch.  Showed FOB how to pull down on chin to open mouth wider, and uncurl lower lip.  Baby's mouth relaxed on the breast.  Identified some swallows occasionally.  Encouraged and demonstrated alternate breast compression while baby was sucking to increase milk transfer. Encouraged STS and cue based feedings, goal of 8-12 feedings per 24 hrs.  Basics reviewed. Mom to call for assistance as needed, and Lactation to follow up in am. Consult Status Consult Status: Follow-up Date: 10/10/16 Follow-up type: In-patient    Judee ClaraSmith, Taqwa Deem E 10/09/2016, 3:59 PM

## 2016-10-09 NOTE — Progress Notes (Signed)
Patient doing well No complaints  BP (!) 93/57 (BP Location: Right Arm)   Pulse 71   Temp 98.1 F (36.7 C) (Oral)   Resp 18   Ht 5\' 1"  (1.549 m)   Wt 86.2 kg (190 lb)   SpO2 99%   Breastfeeding? Unknown   BMI 35.90 kg/m  Results for orders placed or performed during the hospital encounter of 10/08/16 (from the past 24 hour(s))  CBC     Status: Abnormal   Collection Time: 10/09/16  5:48 AM  Result Value Ref Range   WBC 10.8 (H) 4.0 - 10.5 K/uL   RBC 3.57 (L) 3.87 - 5.11 MIL/uL   Hemoglobin 10.2 (L) 12.0 - 15.0 g/dL   HCT 16.130.1 (L) 09.636.0 - 04.546.0 %   MCV 84.3 78.0 - 100.0 fL   MCH 28.6 26.0 - 34.0 pg   MCHC 33.9 30.0 - 36.0 g/dL   RDW 40.915.5 81.111.5 - 91.415.5 %   Platelets 199 150 - 400 K/uL  Collect bld for placenta donatation     Status: None   Collection Time: 10/09/16  5:53 AM  Result Value Ref Range   Placenta donation bld collect COLLECTED BY LABORATORY    Abdomen is soft and non tender  IMPRESSION: POD #! Doing well Routine care

## 2016-10-10 MED ORDER — BISACODYL 10 MG RE SUPP: 10.0000 mg | Freq: Once | RECTAL | Status: DC

## 2016-10-10 NOTE — Progress Notes (Signed)
Patient doing well.  Having some trouble with breastfeeding - I discussed with her.  BP 125/60 (BP Location: Left Arm)   Pulse 61   Temp 97.9 F (36.6 C) (Oral)   Resp 18   Ht 5\' 1"  (1.549 m)   Wt 86.2 kg (190 lb)   SpO2 100%   Breastfeeding? Unknown   BMI 35.90 kg/m  Abdomen is soft and non tender  Incision clean and dry  IMPRESSION: POD #2 Doing well  Routine care  Discharge tomorrow

## 2016-10-10 NOTE — Plan of Care (Signed)
Problem: Pain Management: Goal: General experience of comfort will improve and pain level will decrease Outcome: Progressing During the morning assessment, patient complained of severe nipple pain upon latching the infant, lessening only somewhat after latch achieved. The right nipple appears reddened with a vertical compression stripe. The left nipple appears reddened. Patient has been hand expressing colostrum and rubbing it over the nipples and areolas. She has also been using coconut oil. She has been given hydrogel pads that she has used intermittently and found to be soothing.   I discussed further options with both parents for relieving the nipple pain and healing the nipples, including pumping and/or using a nipple shield for breastfeeding. After some consideration, the patient decided to try a nipple shield. At 1100, I assisted the parents to position a # 24 nipple shield on the right nipple and the infant to the breast. Patient reported considerably less discomfort using the nipple shield. and seemed receptive to continuing to use a nipple shield when breastfeeding. Subsequently, I set up a DEBP for the patient and encouraged her to pump her breasts several times per day if she continues to use the nipple shield.

## 2016-10-10 NOTE — Lactation Note (Addendum)
This note was copied from a baby's chart. Lactation Consultation Note  Patient Name: Samantha Mora LOVFI'EToday's Date: 10/10/2016 Reason for consult: Follow-up assessment;Difficult latch;Breast/nipple pain  Baby 62 hours old. Assisted mom with latching the baby to left breast in football position. Mom's nipples very sore, and to uncomfortable for any additional latching. Mom wanting to take a break from BF for the night. Assisted mom with use of DEBP--parents were having trouble with no pressure on one side. Demonstrated proper assembly of white membrane. Mom has little to no pain while pumping. FOB gave 13 ml of EBM by bottle, and baby tolerated well. Mom able to pump additional 20 ml. Enc giving the 20 ml at next feeding when baby cueing, and then mom will pump again and give what she pumps--30 ml or more with the next feeding. Plan for the night is for FOB to supplement baby with cues while mom pumps, so that mom stays ahead of baby with EBM. Enc mom to re-evaluate putting baby to breast in the morning when nipples not so sore. Demonstrated how to use 5 [place] feeding system with NS to supplement at the breast when able to attempt. Enc using coconut oil as needed with pumping to reduce friction, EBM on nipples after pumping, and then wearing comfort gels.   Mom teary during consultation d/t being tired, in pain and feeling like she is not being successful with BF. Enc mom and discussed how well she is doing with pumping and keeping up with what the baby needs. Discussed assessment and interventions with patient's bedside nurse, Rolly SalterHaley, RN.   Maternal Data    Feeding Feeding Type: Breast Fed Length of feed: 0 min  LATCH Score/Interventions Latch: Repeated attempts needed to sustain latch, nipple held in mouth throughout feeding, stimulation needed to elicit sucking reflex. Intervention(s): Adjust position;Assist with latch;Breast compression  Audible Swallowing: None Intervention(s): Skin to  skin;Hand expression  Type of Nipple: Flat Intervention(s): Hand pump;Double electric pump  Comfort (Breast/Nipple): Filling, red/small blisters or bruises, mild/mod discomfort Problem noted: Cracked, bleeding, blisters, bruises  Problem noted: Severe discomfort Interventions  (Cracked/bleeding/bruising/blister): Expressed breast milk to nipple;Double electric pump Interventions (Mild/moderate discomfort): Comfort gels;Breast shields Interventions (Severe discomfort): Double electric pum;Flange size;Observe pumping  Hold (Positioning): Assistance needed to correctly position infant at breast and maintain latch. Intervention(s): Breastfeeding basics reviewed;Support Pillows;Position options;Skin to skin  LATCH Score: 4  Lactation Tools Discussed/Used Tools: Comfort gels;Flanges;Pump Nipple shield size: 20 Flange Size: 27 Breast pump type: Double-Electric Breast Pump   Consult Status      Samantha Mora 10/10/2016, 10:15 PM

## 2016-10-11 ENCOUNTER — Ambulatory Visit: Payer: Self-pay

## 2016-10-11 MED ORDER — IBUPROFEN 600 MG PO TABS: 600.0000 mg | ORAL_TABLET | Freq: Four times a day (QID) | ORAL | 1 refills | Status: DC

## 2016-10-11 MED ORDER — OXYCODONE-ACETAMINOPHEN 5-325 MG PO TABS: 1.0000 | ORAL_TABLET | ORAL | 0 refills | Status: DC | PRN

## 2016-10-11 NOTE — Lactation Note (Addendum)
This note was copied from a baby's chart. Lactation Consultation Note  Patient Name: Samantha Mora ZOXWR'UToday's Date: 10/11/2016 Reason for consult: Follow-up assessment;Breast/nipple pain Mom pump and bottle feeding since last evening due to sore nipples. She is now receiving 30-60 ml with pumping. Baby just finished 30 ml via bottle. Mom reports she plans to offer breast again once she gets home. LC encouraged Mom to allow [facility] assist with latch before d/c today to be sure nipple shield fits properly and to review supplementing at breast with 5 fr feeding tube/syringe. Advised Mom with pump/bottle feeding to be sure she pumps every 3 hours for 15 minutes to protect milk supply. Breast milk storage guidelines reviewed, advised to refer to PP booklet, page 36-37. If offering breast, advised baby should be at breast 8-12 times in 24 hours. Supplemental guidelines reviewed with parents, hand out given. Engorgement care discussed. OP f/u with Lactation scheduled for Friday, 10/15/16 at 0830. LC left phone number for Mom to call for assist before d/c home. Care for sore nipples reviewed with Mom.   Maternal Data    Feeding Feeding Type: Breast Milk Nipple Type: Slow - flow  LATCH Score/Interventions                      Lactation Tools Discussed/Used Tools: Pump;Nipple Shields;Comfort gels Nipple shield size: 20 Breast pump type: Double-Electric Breast Pump   Consult Status Consult Status: Complete Date: 10/11/16 Follow-up type: In-patient    Alfred LevinsGranger, Caryle Helgeson Ann 10/11/2016, 9:21 AM

## 2016-10-11 NOTE — Discharge Summary (Signed)
Obstetric Discharge Summary Reason for Admission: cesarean section Prenatal Procedures: ultrasound Intrapartum Procedures: cesarean: low cervical, transverse Postpartum Procedures: none Complications-Operative and Postpartum: none Hemoglobin  Date Value Ref Range Status  10/09/2016 10.2 (L) 12.0 - 15.0 g/dL Final   HCT  Date Value Ref Range Status  10/09/2016 30.1 (L) 36.0 - 46.0 % Final    Physical Exam:  General: alert and cooperative Lochia: appropriate Uterine Fundus: firm Incision: healing well DVT Evaluation: No evidence of DVT seen on physical exam. Negative Homan's sign. No cords or calf tenderness. Calf/Ankle edema is present.  Discharge Diagnoses: Term Pregnancy-delivered  Discharge Information: Date: 10/11/2016 Activity: pelvic rest Diet: routine Medications: PNV, Ibuprofen and Percocet Condition: stable Instructions: refer to practice specific booklet Discharge to: home   Newborn Data: Live born female  Birth Weight: 7 lb 9.3 oz (3440 g) APGAR: 8, 8  Home with mother.  Deroy Noah G 10/11/2016, 8:25 AM

## 2016-10-11 NOTE — Lactation Note (Signed)
This note was copied from a baby's chart. Lactation Consultation Note  Patient Name: Samantha Mora XBJYN'WToday's Date: 10/11/2016 Reason for consult: Follow-up assessment;Breast/nipple pain Mom called for LC to assist with latch before d/c home. Initial latch with 20 nipple shield but baby had difficulty obtaining latch. Changed nipple shield to 24 and baby was able to latch and demonstrate few good suckling bursts.Lots of colostrum present in nipple shield.  This was still painful for Mom. Mom planning to continue to pump/bottle till nipples heal. Mom has DEBP for home. Advised to continue to pump every 3 hours for 15 minutes. Mom pumping 30-60 ml. Engorgement care reviewed with Mom. Advised to offer breast when she can so baby will learn to latch, use 24 nipple shield. Change to Dr. Manson PasseyBrown bottle #1 with supplementing, paced feeding discussed. OP f/u scheduled for Friday, 10/15/16 at 8:30. Mom to call for questions/concerns.   Maternal Data    Feeding Feeding Type: Breast Fed Length of feed: 5 min (off/on)  LATCH Score/Interventions Latch: Grasps breast easily, tongue down, lips flanged, rhythmical sucking. (using 24 nipple shield) Intervention(s): Adjust position;Assist with latch;Breast massage  Audible Swallowing: None  Type of Nipple: Flat  Comfort (Breast/Nipple): Filling, red/small blisters or bruises, mild/mod discomfort  Problem noted: Cracked, bleeding, blisters, bruises;Severe discomfort Interventions  (Cracked/bleeding/bruising/blister): Expressed breast milk to nipple Interventions (Mild/moderate discomfort): Comfort gels  Hold (Positioning): Assistance needed to correctly position infant at breast and maintain latch. Intervention(s): Breastfeeding basics reviewed;Support Pillows;Position options;Skin to skin  LATCH Score: 5  Lactation Tools Discussed/Used Tools: Pump;Nipple Shields;10F feeding tube / Syringe;Comfort gels Nipple shield size: 20;24 Breast pump type:  Double-Electric Breast Pump   Consult Status Consult Status: Complete Date: 10/11/16 Follow-up type: In-patient    Alfred LevinsGranger, Trinita Devlin Ann 10/11/2016, 3:17 PM

## 2016-10-15 ENCOUNTER — Ambulatory Visit: Payer: Self-pay

## 2016-10-15 NOTE — Lactation Note (Signed)
This note was copied from a baby's chart. Lactation Consult  Mother's reason for visit:  Check baby latching Visit Type:  outpatient Appointment Notes:  See below Consult:  Initial Lactation Consultant:  Judee Clara  ________________________________________________________________________ Samantha Mora Name:  Samantha Mora Date of Birth:  10/08/2017 Pediatrician:  [place] Gender:  female Gestational Age: [redacted]w[redacted]d (At Birth) Birth Weight:  7 lb 9.3 oz (3440 g) Weight at Discharge:  7 lbs .4 oz ( 7%)                        Date of Discharge:  10/11/16 There were no vitals filed for this visit. [facility]  10/13/16 7 lbs 2 oz     Weight today:  7 lbs 3.6 oz   ________________________________________________________________________  Mother's Name: Samantha Mora Type of delivery:  C-Section, Low Transverse Breastfeeding Experience: first baby Maternal Medical Conditions:  none Maternal Medications: Tylenol, ibuprofen, PNV  ________________________________________________________________________  Breastfeeding History (Post Discharge)  Frequency of breastfeeding:  Once a day Duration of feeding:  20-30 mins  Supplementation   Breastmilk:  Volume 60 ml Frequency:  3-4 hrs Total volume per day:  480 ml  Method:  Bottle  Infant Intake and Output Assessment  Voids:  6 in 24 hrs.  Color:  Clear yellow Stools:  3 in 24 hrs.  Color:  Green and Yellow  ________________________________________________________________________  Maternal Breast Assessment  Breast:  full Nipple:  Erect, Reddened and Blister Pain level:  0 Pain interventions:  Comfort gels and Cream/oil  [place] in today after letting her nipples rest for ~ 5 days.  Both nipples pink, but without noticeable trauma.  Mom states she has put baby to the breast once a day for the last 2 days, and baby would stay on for 20-30 mins.  Baby usually getting 2-2.5  oz per feeding by bottle.  Mom has an abundant milk supply.  Right breast has a slightly harder area on outer breast, no redness, no warmth, no temp.  Mom advised to use warm, moist compresses and massage prior to feed or pump.    Assisted with latching in cross cradle hold.  Mom using a C hold when latching.  Advised and assisted Mom to understand the benefit of scooping under her breast to a U hold to sandwich her large, full breast to accommodate baby's mouth.  Baby latched and fed for 20 minutes with multiple swallows heard. Demonstrated and encouraged Mom to use alternate breast compression to increase milk transfer at the breast.  Mom stated the breast felt softer.  Baby transferred 58 ml from right breast.  Tried to latch onto right side, but baby not rooting and was acting contented.    Mom to continue to try to latch onto right breast, maybe when baby isn't so hungry.  Plan- 1-Breastfeed on cue (Goal is 8-12 times per 24 hrs) Skin to skin 2-May pre-pump 3-5 mins to soften breast if full-and help Magda Paganini get to hind milk (increase fat content) 3- U hold if using X cradle to help [employer] deeper 4-Alternate breast compression during feeding can increase milk transfer 5-Pump both breasts after feeding to soften- be careful not to over pump and increase over supply 6-If not breastfeeding- feed baby 2.5-3 oz expressed breast milk and pump both breasts 20 mins 7-warm, moist compress and massage prior to pumping or feeding right outer breast area.  To observe for symptoms  of Mastitis 8- Call prn as needed.   _____________________________________________________________________ Feeding Assessment/Evaluation  Initial feeding assessment:  Infant's oral assessment:  Variance Upper lip tie  Positioning:  Cross cradle Left breast  LATCH documentation:  Latch:  2 = Grasps breast easily, tongue down, lips flanged, rhythmical sucking.  Audible swallowing:  2 = Spontaneous and  intermittent  Type of nipple:  2 = Everted at rest and after stimulation  Comfort (Breast/Nipple):  1 = Filling, red/small blisters or bruises, mild/mod discomfort  Hold (Positioning):  1 = Assistance needed to correctly position infant at breast and maintain latch  LATCH score:  8  Attached assessment:  Deep  Lips flanged:  No. lower  Lips untucked:  Yes.    Suck assessment:  Nutritive  Tools:  Pump Instructed on use and cleaning of tool:  Yes.    Pre-feed weight:  3256 g  Post-feed weight:  3314 g  Amount transferred:  58 ml  Total amount pumped post feed:  R 3 oz   L 3 oz  Total amount transferred:  58 ml Total supplement given:  0 ml

## 2016-11-26 ENCOUNTER — Ambulatory Visit: Payer: Self-pay

## 2016-11-26 NOTE — Lactation Note (Signed)
This note was copied from a baby's chart. Lactation Consult  Mother's reason for visit:  Baby has only been bottle feeding breastmilk.  Mother would like help re-latching to breast. Visit Type:  Outpatien Appointment Notes:  96 week old infant.  Mother started pumping and bottle feeding on the 3rd day of life due to sore nipples and exhaustion.  Wanted help re-latching baby. Consult:  Follow-Up Lactation Consultant:  Samantha Mora, Samantha Mora  ________________________________________________________________________ Samantha FloresBaby's Name:  Samantha CantorAlison S Mora Date of Birth:  02/27/1986 Pediatrician:  Samantha Mora Gender:  female Gestational Age: <None> (At Birth) Birth Weight:    Weight at Discharge:  Weight: 3040 oz                      Date of Discharge:  10/11/2016    [facility]   10/08/16 [id number]  Weight: 3040 oz  [facility]:  9 lb 6 oz     Location:Pediatrician's office Weight today:  9 lb 5.4 oz. ________________________________________________________________________  Mother's Name: Samantha LothAudrey Jean Mora Type of delivery:  C-Section, Low Transverse Breastfeeding Experience:  Primip Maternal Medical Conditions: none Maternal Medications:  PNV ________________________________________________________________________  Breastfeeding History (Post Discharge)  Frequency of breastfeeding:  none  Pumping  Type of pump:  Medela pump in style Frequency:  q 3 hours Volume:  60-90 ml  Infant Intake and Output Assessment  Voids/stools WNl ________________________________________________________________________  Maternal Breast Assessment  Breast:  Full Nipple:  Erect Pain level:  0 _______________________________________________________________________ Feeding Assessment/Evaluation  Initial feeding assessment:  Infant's oral assessment:  WNL  Positioning:  Cross cradle Right breast  LATCH documentation:  Latch:  2 = Grasps breast  easily, tongue down, lips flanged, rhythmical sucking.  Audible swallowing:  1 = A few with stimulation  Type of nipple:  2 = Everted at rest and after stimulation  Comfort (Breast/Nipple):  2 = Soft / non-tender  Hold (Positioning):  1 = Assistance needed to correctly position infant at breast and maintain latch  LATCH score:  8  Attached assessment:  Deep  Lips flanged:  Yes.    Lips untucked:  Yes.    Suck assessment:  Nutritive  Tools:  Nipple shield 20 mm Instructed on use and cleaning of tool:  Yes.    Pre-feed weight:  4310 g  (9 lb. 5.4 oz.) Post-feed weight:  4236 g (9 lb. 8.0 oz.) Amount transferred:  74 ml Amount supplemented:  0 ml  No  Total amount transferred:  74 ml Total supplement given:  30 ml  Plan: Allow baby to have lots of skin to skin time without forcing breastfeeding.  Allow baby to nuzzle at breast often. When hungry, apply #24 nipple shield to latch baby.  Half way through feeding try latching without nipple shield. If baby is excessively fussy, give baby slow flow bottle of breastmilk and try latching after 1-2 oz. Reduce the amount of time baby spends sucking on pacifier. If baby is latching with nipple shield, continue to post pump 2-3 times per day and give baby back volume pumped.

## 2016-12-02 NOTE — Addendum Note (Signed)
Addendum  created 12/02/16 1440 by Shaliah Wann, MD   Sign clinical note    

## 2016-12-02 NOTE — Anesthesia Postprocedure Evaluation (Signed)
Anesthesia Post Note  Patient: Samantha Mora  Procedure(s) Performed: Procedure(s) (LRB): CESAREAN SECTION (N/A)     Anesthesia Post Evaluation  Last Vitals:  Vitals:   10/10/16 2001 10/11/16 0639  BP: 134/74 125/76  Pulse: 75 66  Resp: 18 18  Temp:  36.7 C    Last Pain:  Vitals:   10/11/16 1105  TempSrc:   PainSc: 3                  Phillips Groutarignan, Jayceon Troy

## 2017-05-04 IMAGING — US US MFM OB LIMITED
1 series · 15 of 17 positions shown · non-contrast
Comparison: none

[Series 1: us mfm ob limited · 17 acquisitions, 15 frames shown]
[im 1/17]
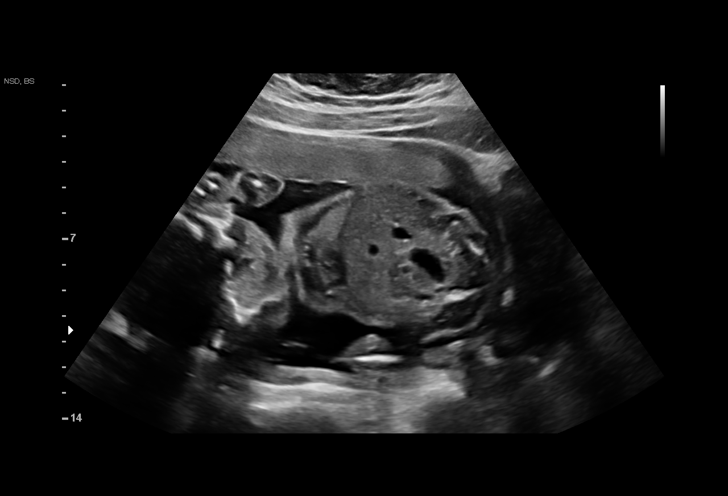
[im 2/17]
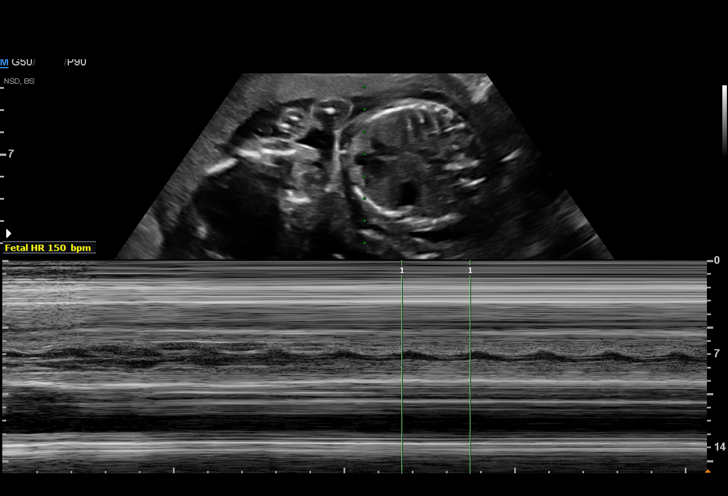
[im 3/17]
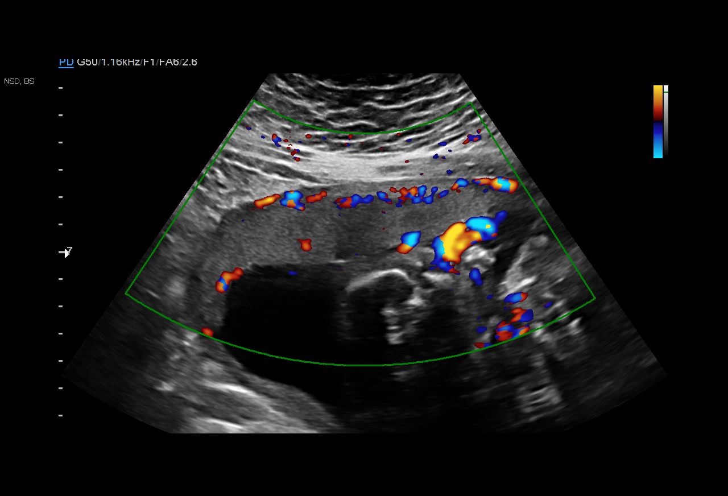
[im 4/17]
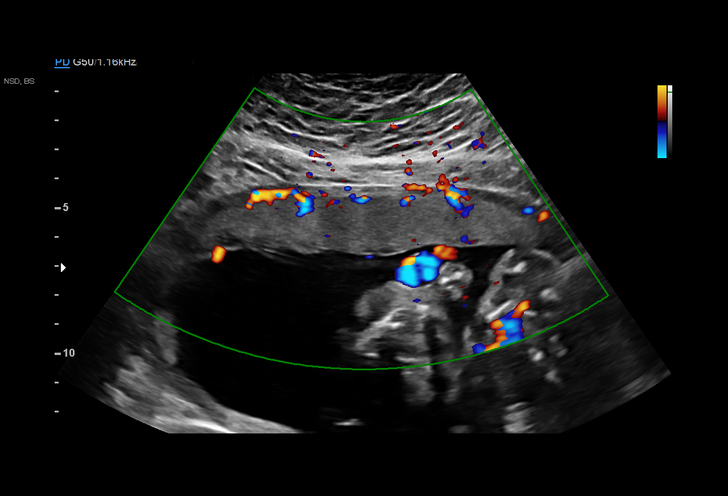
[im 6/17]
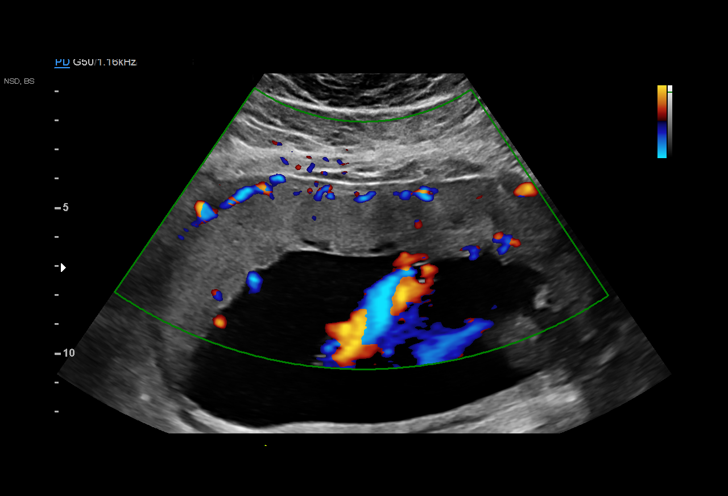
[im 7/17]
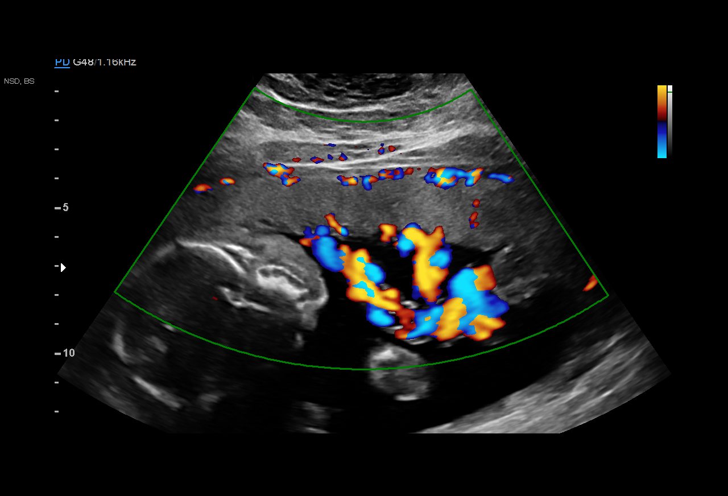
[im 8/17]
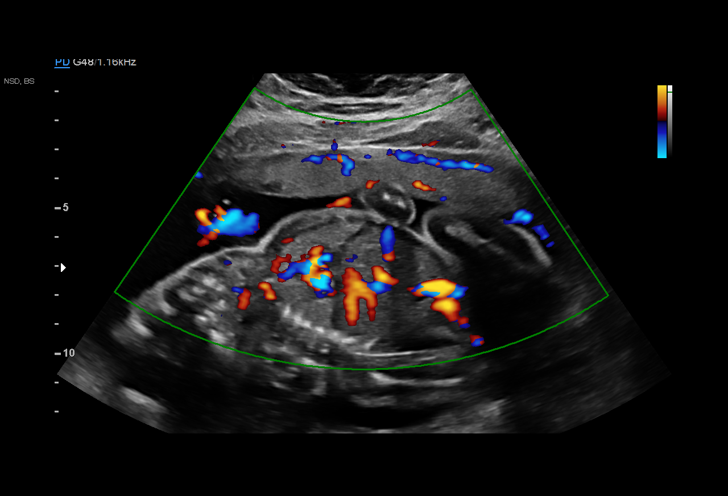
[im 9/17]
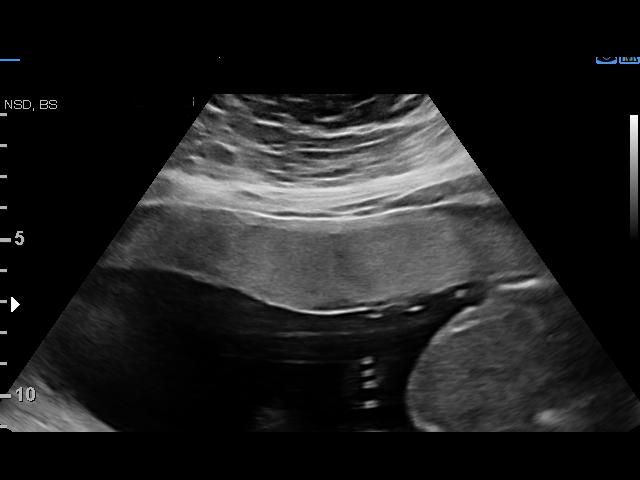
[im 10/17]
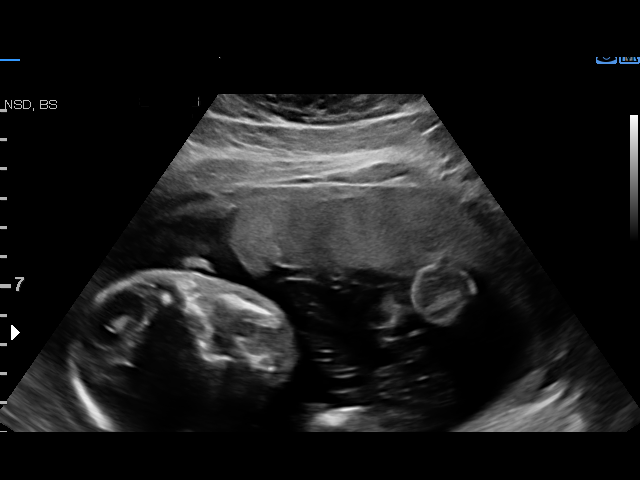
[im 11/17]
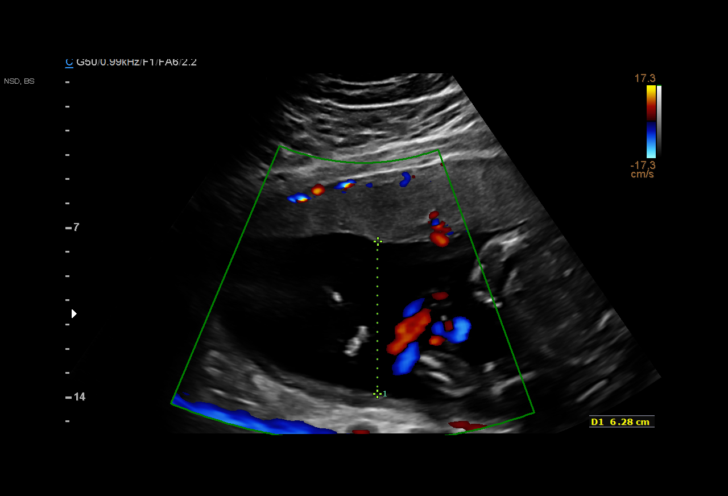
[im 12/17]
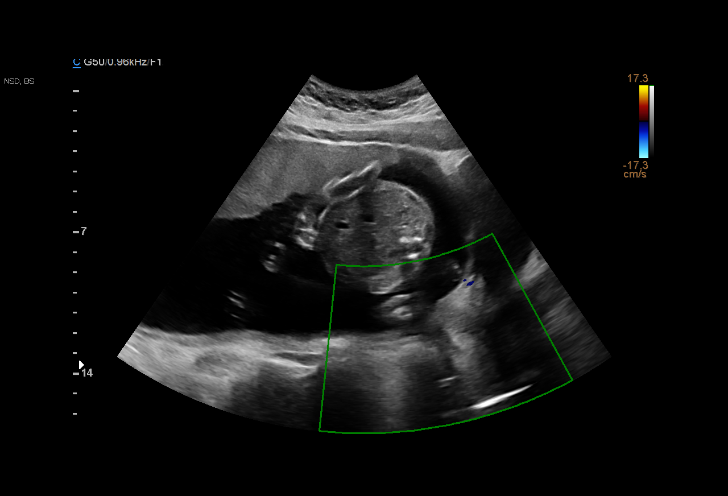
[im 14/17]
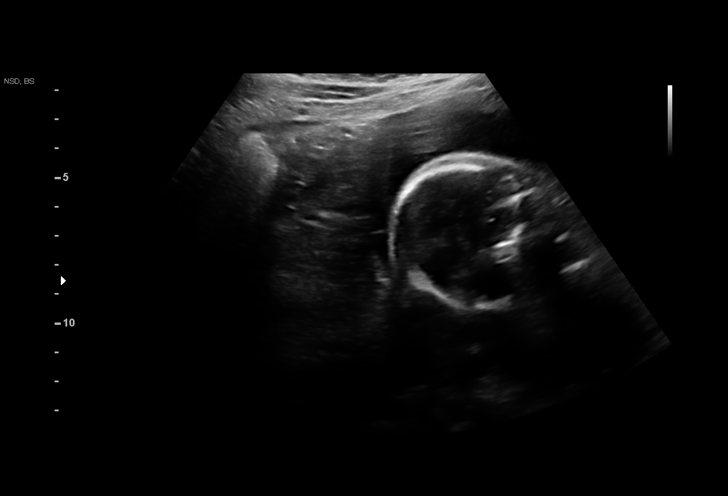
[im 15/17]
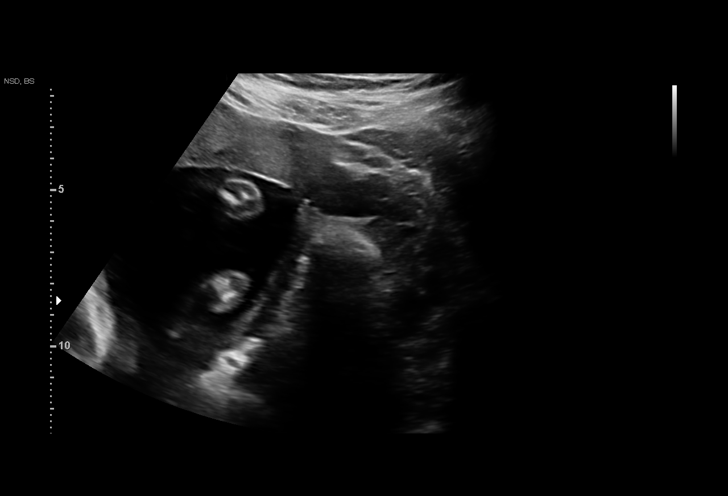
[im 16/17]
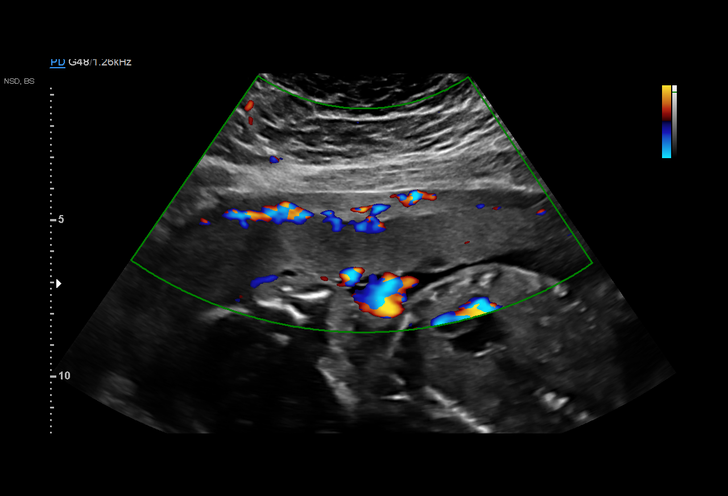
[im 17/17]
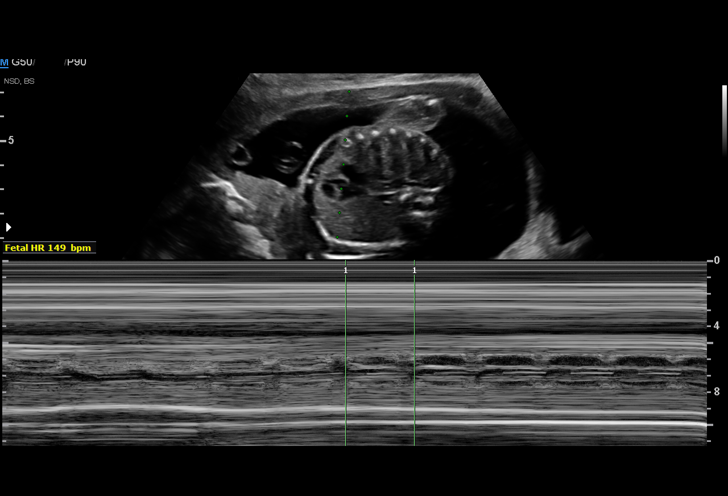

[15 of 17 positions shown; findings below may reference images not displayed]

[address]
[facility] [HOSPITAL]
Attending:        Elmer Javier Francesca         Secondary Phy.:    GIORGI Nursing-
MAU/Triage

Indications

22 weeks gestation of pregnancy
Traumatic injury during pregnancy (Pt fell)
OB History

Gravidity:    1
Fetal Evaluation

Num Of Fetuses:     1
Fetal Heart         150
Rate(bpm):
Cardiac Activity:   Observed
Presentation:       Breech
Placenta:           Anterior, above cervical os
P. Cord Insertion:  Previously seen as normal

Amniotic Fluid
AFI FV:      Subjectively within normal limits

Largest Pocket(cm)
6.3
Gestational Age

Best:          22w 2d     Det. By:  Previous Ultrasound      EDD:   10/15/16
(03/31/16)
Cervix Uterus Adnexa

Cervix
Length:            3.8  cm.
Normal appearance by transabdominal scan.

Left Ovary
Previously seen.

Right Ovary
Not visualized.

Adnexa:       No abnormality visualized.
Impression

Single living intrauterine pregnancy at 77w7d.
Breech presentation.
Normal amniotic fluid volume.
Normal transabdominal cervical length.
No sonographic evidence for placental abruption; however,
the sensitivity of ultrasound for the detection of placental
abruption is poor. Clinical correlation recommended.
Recommendations

Follow-up ultrasounds as clinically indicated.

## 2017-11-17 LAB — OB RESULTS CONSOLE RPR: RPR: NONREACTIVE

## 2017-11-17 LAB — OB RESULTS CONSOLE GC/CHLAMYDIA
Chlamydia: NEGATIVE
Gonorrhea: NEGATIVE

## 2017-11-17 LAB — OB RESULTS CONSOLE HEPATITIS B SURFACE ANTIGEN: Hepatitis B Surface Ag: NEGATIVE

## 2017-11-17 LAB — OB RESULTS CONSOLE ABO/RH: RH Type: POSITIVE

## 2017-11-17 LAB — OB RESULTS CONSOLE RUBELLA ANTIBODY, IGM: Rubella: IMMUNE

## 2017-11-17 LAB — OB RESULTS CONSOLE ANTIBODY SCREEN: Antibody Screen: NEGATIVE

## 2017-11-17 LAB — OB RESULTS CONSOLE HIV ANTIBODY (ROUTINE TESTING): HIV: NONREACTIVE

## 2018-03-14 ENCOUNTER — Encounter (HOSPITAL_COMMUNITY): Payer: Self-pay | Admitting: *Deleted

## 2018-03-14 ENCOUNTER — Inpatient Hospital Stay (HOSPITAL_COMMUNITY)
Admission: AD | Admit: 2018-03-14 | Discharge: 2018-03-14 | Disposition: A | Discharge status: Home / Self Care | Payer: BLUE CROSS/BLUE SHIELD | Source: Ambulatory Visit | Attending: Obstetrics and Gynecology | Admitting: Obstetrics and Gynecology

## 2018-03-14 DIAGNOSIS — Z833 Family history of diabetes mellitus: Secondary | ICD-10-CM | POA: Diagnosis not present

## 2018-03-14 DIAGNOSIS — Z9889 Other specified postprocedural states: Secondary | ICD-10-CM | POA: Insufficient documentation

## 2018-03-14 DIAGNOSIS — O26892 Other specified pregnancy related conditions, second trimester: Secondary | ICD-10-CM | POA: Insufficient documentation

## 2018-03-14 DIAGNOSIS — O219 Vomiting of pregnancy, unspecified: Secondary | ICD-10-CM | POA: Diagnosis not present

## 2018-03-14 DIAGNOSIS — A881 Epidemic vertigo: Secondary | ICD-10-CM | POA: Diagnosis not present

## 2018-03-14 DIAGNOSIS — Z791 Long term (current) use of non-steroidal anti-inflammatories (NSAID): Secondary | ICD-10-CM | POA: Diagnosis not present

## 2018-03-14 DIAGNOSIS — R42 Dizziness and giddiness: Secondary | ICD-10-CM | POA: Diagnosis present

## 2018-03-14 DIAGNOSIS — Z79891 Long term (current) use of opiate analgesic: Secondary | ICD-10-CM | POA: Diagnosis not present

## 2018-03-14 DIAGNOSIS — Z3A24 24 weeks gestation of pregnancy: Secondary | ICD-10-CM | POA: Diagnosis not present

## 2018-03-14 DIAGNOSIS — O212 Late vomiting of pregnancy: Secondary | ICD-10-CM | POA: Diagnosis not present

## 2018-03-14 LAB — URINALYSIS, ROUTINE W REFLEX MICROSCOPIC
Bilirubin Urine: NEGATIVE
GLUCOSE, UA: NEGATIVE mg/dL
HGB URINE DIPSTICK: NEGATIVE
KETONES UR: 80 mg/dL — AB
NITRITE: NEGATIVE
PROTEIN: 30 mg/dL — AB
Specific Gravity, Urine: 1.019 (ref 1.005–1.030)
pH: 5 (ref 5.0–8.0)

## 2018-03-14 LAB — CBC WITH DIFFERENTIAL/PLATELET
BASOS PCT: 0 %
Basophils Absolute: 0 10*3/uL (ref 0.0–0.1)
EOS ABS: 0 10*3/uL (ref 0.0–0.5)
EOS PCT: 0 %
HEMATOCRIT: 33.9 % — AB (ref 36.0–46.0)
Hemoglobin: 12 g/dL (ref 12.0–15.0)
Lymphocytes Relative: 13 %
Lymphs Abs: 1.5 10*3/uL (ref 0.7–4.0)
MCH: 31 pg (ref 26.0–34.0)
MCHC: 35.4 g/dL (ref 30.0–36.0)
MCV: 87.6 fL (ref 80.0–100.0)
MONO ABS: 0.6 10*3/uL (ref 0.1–1.0)
MONOS PCT: 5 %
NEUTROS ABS: 9.8 10*3/uL — AB (ref 1.7–7.7)
Neutrophils Relative %: 82 %
PLATELETS: 229 10*3/uL (ref 150–400)
RBC: 3.87 MIL/uL (ref 3.87–5.11)
RDW: 13.1 % (ref 11.5–15.5)
WBC: 12 10*3/uL — ABNORMAL HIGH (ref 4.0–10.5)
nRBC: 0 % (ref 0.0–0.2)

## 2018-03-14 LAB — COMPREHENSIVE METABOLIC PANEL
ALBUMIN: 3.1 g/dL — AB (ref 3.5–5.0)
ALT: 13 U/L (ref 0–44)
AST: 14 U/L — ABNORMAL LOW (ref 15–41)
Alkaline Phosphatase: 47 U/L (ref 38–126)
Anion gap: 9 (ref 5–15)
BILIRUBIN TOTAL: 1 mg/dL (ref 0.3–1.2)
BUN: 8 mg/dL (ref 6–20)
CO2: 22 mmol/L (ref 22–32)
Calcium: 8.7 mg/dL — ABNORMAL LOW (ref 8.9–10.3)
Chloride: 105 mmol/L (ref 98–111)
Creatinine, Ser: 0.5 mg/dL (ref 0.44–1.00)
GFR calc Af Amer: >60 mL/min (ref >60)
GFR calc non Af Amer: >60 mL/min (ref >60)
GLUCOSE: 94 mg/dL (ref 70–99)
POTASSIUM: 3.9 mmol/L (ref 3.5–5.1)
Sodium: 136 mmol/L (ref 135–145)
Total Protein: 6.4 g/dL — ABNORMAL LOW (ref 6.5–8.1)

## 2018-03-14 MED ORDER — M.V.I. ADULT IV INJ
Freq: Once | INTRAVENOUS | Status: AC
  Administered 2018-03-14: 20:00:00 via INTRAVENOUS
  Filled 2018-03-14: qty 10

## 2018-03-14 MED ORDER — MECLIZINE HCL 12.5 MG PO TABS
12.5000 mg | ORAL_TABLET | Freq: Once | ORAL | Status: AC
  Administered 2018-03-14: 12.5 mg via ORAL
  Filled 2018-03-14: qty 1

## 2018-03-14 MED ORDER — MECLIZINE HCL 12.5 MG PO TABS: 12.5000 mg | ORAL_TABLET | Freq: Three times a day (TID) | ORAL | 0 refills | Status: DC | PRN

## 2018-03-14 MED ORDER — PROMETHAZINE HCL 25 MG/ML IJ SOLN
25.0000 mg | Freq: Once | INTRAVENOUS | Status: AC
  Administered 2018-03-14: 25 mg via INTRAVENOUS
  Filled 2018-03-14: qty 1

## 2018-03-14 MED ORDER — DEXTROSE IN LACTATED RINGERS 5 % IV SOLN: Freq: Once | INTRAVENOUS | Status: DC

## 2018-03-14 NOTE — MAU Note (Signed)
Patient states she woke up this morning around 0800 feeling very dizzy and nauseated.  Denies pain or headache, but feels like everything is "spinning." Denies fever.  Denies diarrhea.  Reports that she has vomited after trying to drink something and eat crackers around noon.  Denies ever having anything like this happen before.

## 2018-03-14 NOTE — MAU Provider Note (Addendum)
History     CSN: [id number]  Arrival date and time: 03/14/18 1645   First Provider Initiated Contact with Patient 03/14/18 1716      Chief Complaint  Patient presents with  . Dizziness  . Nausea   HPI  Samantha Mora is a 32 y.o. G2P1001 at [redacted]w[redacted]d who presents to MAU with chief complaint of new onset dizziness, nausea. These complaints occur together, onset 8am today. Patient is unable to ambulate due to extreme dizziness. Denies headache, vaginal bleeding, leaking of fluid, decreased fetal movement, fever, falls, or recent illness.    Patient endorses tolerating a pop tart at 3am today. Also states she tried crackers around lunchtime but was unable to keep them down. Has not attempted to manage with medication or other treatments. Denies history of anemia, vertigo.  OB History    Gravida  2   Para  1   Term  1   Preterm      AB      Living  1     SAB      TAB      Ectopic      Multiple  0   Live Births  1           Past Medical History:  Diagnosis Date  . Medical history non-contributory     Past Surgical History:  Procedure Laterality Date  . ANTERIOR CRUCIATE LIGAMENT REPAIR    . CESAREAN SECTION N/A 10/08/2016   Procedure: CESAREAN SECTION;  Surgeon: Mitchel Honour, DO;  [facility];  Service: Obstetrics;  Laterality: N/A;  NEED RNFA  - NO RNFA AVAILABLE FROM OB OR [facility] 10/15/16 NKDA   . TONSILLECTOMY AND ADENOIDECTOMY    . TYMPANOSTOMY TUBE PLACEMENT    . WRIST SURGERY      Family History  Problem Relation Age of Onset  . Heart disease Father   . Hypertension Father   . Anemia Paternal Aunt   . Heart disease Maternal Grandmother   . Cancer Maternal Grandmother        gastric  . Anemia Paternal Grandmother   . Cancer Paternal Grandmother        breast  . Heart disease Paternal Grandfather   . Diabetes Paternal Grandfather     Social History   Tobacco Use  . Smoking status: Never Smoker  . Smokeless tobacco:  Never Used  Substance Use Topics  . Alcohol use: No  . Drug use: No    Allergies: No Known Allergies  Medications Prior to Admission  Medication Sig Dispense Refill Last Dose  . ibuprofen (ADVIL,MOTRIN) 600 MG tablet Take 1 tablet (600 mg total) by mouth every 6 (six) hours. 30 tablet 1   . oxyCODONE-acetaminophen (PERCOCET/ROXICET) 5-325 MG tablet Take 1 tablet by mouth every 4 (four) hours as needed (pain scale 4-7). 10 tablet 0   . Prenatal Vit-Fe Fumarate-FA (PRENATAL VITAMIN PO) Take 1 tablet by mouth daily.    06/12/2016    Review of Systems  Constitutional: Positive for fatigue. Negative for chills and fever.  Respiratory: Negative for chest tightness and shortness of breath.   Gastrointestinal: Positive for nausea and vomiting. Negative for abdominal pain and diarrhea.  Genitourinary: Negative for difficulty urinating, dyspareunia, dysuria, flank pain, vaginal bleeding, vaginal discharge and vaginal pain.  Neurological: Negative for dizziness and headaches.  All other systems reviewed and are negative.  Physical Exam   Blood pressure 110/82, pulse 100, temperature (!) 97.5 F (36.4 C), temperature source Oral,  resp. rate 16, last menstrual period 09/23/2017, SpO2 100 %, unknown if currently breastfeeding.  Physical Exam  Nursing note and vitals reviewed. Constitutional: She is oriented to person, place, and time. She appears well-developed and well-nourished.  Respiratory: Effort normal. No respiratory distress.  GI: She exhibits no distension. There is no tenderness. There is no rebound, no guarding and no CVA tenderness.  Gravid  Neurological: She is alert and oriented to person, place, and time.  Skin: Skin is warm and dry.  Psychiatric: She has a normal mood and affect. Her behavior is normal. Judgment and thought content normal.    MAU Course  Procedures  MDM  --Reactive fetal tracing: baseline 135, moderate variability, positivie 10x10 accelerations, no  decelerations --Toco: quiet  Patient Vitals for the past 24 hrs:  BP Temp Temp src Pulse Resp SpO2  03/14/18 1756 - - - - - 100 %  03/14/18 1701 110/82 (!) 97.5 F (36.4 C) Oral 100 16 100 %    Orders Placed This Encounter  Procedures  . Urinalysis, Routine w reflex microscopic    Standing Status:   Standing    Number of Occurrences:   1  . CBC with Differential/Platelet    Standing Status:   Standing    Number of Occurrences:   1  . Comprehensive metabolic panel    Standing Status:   Standing    Number of Occurrences:   1  . Insert peripheral IV    Standing Status:   Standing    Number of Occurrences:   1   Results for orders placed or performed during the hospital encounter of 03/14/18 (from the past 24 hour(s))  Urinalysis, Routine w reflex microscopic     Status: Abnormal   Collection Time: 03/14/18  5:28 PM  Result Value Ref Range   Color, Urine YELLOW YELLOW   APPearance CLOUDY (A) CLEAR   Specific Gravity, Urine 1.019 1.005 - 1.030   pH 5.0 5.0 - 8.0   Glucose, UA NEGATIVE NEGATIVE mg/dL   Hgb urine dipstick NEGATIVE NEGATIVE   Bilirubin Urine NEGATIVE NEGATIVE   Ketones, ur 80 (A) NEGATIVE mg/dL   Protein, ur 30 (A) NEGATIVE mg/dL   Nitrite NEGATIVE NEGATIVE   Leukocytes, UA TRACE (A) NEGATIVE   RBC / HPF 0-5 0 - 5 RBC/hpf   WBC, UA 6-10 0 - 5 WBC/hpf   Bacteria, UA RARE (A) NONE SEEN   Squamous Epithelial / LPF 6-10 0 - 5   Mucus PRESENT   CBC with Differential/Platelet     Status: Abnormal   Collection Time: 03/14/18  5:56 PM  Result Value Ref Range   WBC 12.0 (H) 4.0 - 10.5 K/uL   RBC 3.87 3.87 - 5.11 MIL/uL   Hemoglobin 12.0 12.0 - 15.0 g/dL   HCT 09.8 (L) 11.9 - 14.7 %   MCV 87.6 80.0 - 100.0 fL   MCH 31.0 26.0 - 34.0 pg   MCHC 35.4 30.0 - 36.0 g/dL   RDW 82.9 56.2 - 13.0 %   Platelets 229 150 - 400 K/uL   nRBC 0.0 0.0 - 0.2 %   Neutrophils Relative % 82 %   Neutro Abs 9.8 (H) 1.7 - 7.7 K/uL   Lymphocytes Relative 13 %   Lymphs Abs 1.5 0.7 -  4.0 K/uL   Monocytes Relative 5 %   Monocytes Absolute 0.6 0.1 - 1.0 K/uL   Eosinophils Relative 0 %   Eosinophils Absolute 0.0 0.0 - 0.5 K/uL   Basophils  Relative 0 %   Basophils Absolute 0.0 0.0 - 0.1 K/uL  Comprehensive metabolic panel     Status: Abnormal   Collection Time: 03/14/18  5:56 PM  Result Value Ref Range   Sodium 136 135 - 145 mmol/L   Potassium 3.9 3.5 - 5.1 mmol/L   Chloride 105 98 - 111 mmol/L   CO2 22 22 - 32 mmol/L   Glucose, Bld 94 70 - 99 mg/dL   BUN 8 6 - 20 mg/dL   Creatinine, Ser 4.09 0.44 - 1.00 mg/dL   Calcium 8.7 (L) 8.9 - 10.3 mg/dL   Total Protein 6.4 (L) 6.5 - 8.1 g/dL   Albumin 3.1 (L) 3.5 - 5.0 g/dL   AST 14 (L) 15 - 41 U/L   ALT 13 0 - 44 U/L   Alkaline Phosphatase 47 38 - 126 U/L   Total Bilirubin 1.0 0.3 - 1.2 mg/dL   GFR calc non Af Amer >60 >60 mL/min   GFR calc Af Amer >60 >60 mL/min   Anion gap 9 5 - 15   Meds ordered this encounter  Medications  . multivitamins adult (MVI -12) 10 mL in lactated ringers 1,000 mL infusion  . promethazine (PHENERGAN) 25 mg in lactated ringers 1,000 mL infusion    Report given to V. Aundria Rud, CNM who assumes care of patient at this time.  Clayton Bibles, CNM 03/14/18  8:02 PM    Additional physical examination completed  Ear: tympanic membrane pearly gray and non bulging bilaterally  Patient denies hx of ear infections or ear pain, HA prior to arrival to MAU  Orthostatic vitals stable  NST reactive   Consult with Dr Adrian Blackwater on assessment and labs. Recommended Meclizine for vertigo/dizziness with follow up in the office.   Treatments in MAU included meclizine prior to discharge home due to patient's pharmacy being closed for the night. Vertigo consistent with inner ear abnormalities, physical examination of bilateral ears WNL.  Patient reports resolution of vertigo after medication treatment.   Recommend patient to follow up in the office of OBGYN in 1 week with possibility of referral to ENT If  worsening or no resolution of vertigo. Pt stable at time of discharge.  Assessment and Plan   1. Dizziness   2. Acute epidemic vertigo   3. Nausea and vomiting during pregnancy   4. [redacted] weeks gestation of pregnancy    Discharge home  Follow up at [facility] in 1 week  Possible referral to ENT  Rx for Meclizine   Follow-up Information    [place], [facility] Schedule an appointment as soon as possible for a visit.   Why:  Make appointment to be seen in the office for follow up on Vertigo  Contact information: [address] [place] [address] [phone]          Allergies as of 03/14/2018   No Known Allergies     Medication List    STOP taking these medications   ibuprofen 600 MG tablet Commonly known as:  ADVIL,MOTRIN   oxyCODONE-acetaminophen 5-325 MG tablet Commonly known as:  PERCOCET/ROXICET     TAKE these medications   meclizine 12.5 MG tablet Commonly known as:  ANTIVERT Take 1 tablet (12.5 mg total) by mouth 3 (three) times daily as needed for dizziness.   PRENATAL VITAMIN PO Take 1 tablet by mouth daily.      Sharyon Cable, CNM 03/14/18, 10:35 PM

## 2018-03-16 LAB — CULTURE, OB URINE

## 2018-05-30 LAB — OB RESULTS CONSOLE GBS: GBS: POSITIVE

## 2018-06-06 ENCOUNTER — Encounter

## 2018-06-06 ENCOUNTER — Ambulatory Visit: Payer: Self-pay | Admitting: Neurology

## 2018-06-15 ENCOUNTER — Telehealth (HOSPITAL_COMMUNITY): Payer: Self-pay | Admitting: *Deleted

## 2018-06-15 NOTE — Telephone Encounter (Signed)
Preadmission screen  

## 2018-06-23 ENCOUNTER — Encounter (HOSPITAL_COMMUNITY): Payer: Self-pay

## 2018-06-23 ENCOUNTER — Inpatient Hospital Stay (HOSPITAL_COMMUNITY)
Admission: RE | Admit: 2018-06-23 | Discharge: 2018-06-26 | DRG: 788 | Disposition: A | Discharge status: Home / Self Care | Payer: BLUE CROSS/BLUE SHIELD | Attending: Obstetrics & Gynecology | Admitting: Obstetrics & Gynecology

## 2018-06-23 ENCOUNTER — Other Ambulatory Visit: Payer: Self-pay

## 2018-06-23 VITALS — BP 129/80 | HR 67 | Temp 97.9°F | Resp 16 | Ht 61.0 in | Wt 193.3 lb

## 2018-06-23 DIAGNOSIS — O34211 Maternal care for low transverse scar from previous cesarean delivery: Secondary | ICD-10-CM | POA: Diagnosis present

## 2018-06-23 DIAGNOSIS — Z3A39 39 weeks gestation of pregnancy: Secondary | ICD-10-CM

## 2018-06-23 DIAGNOSIS — O99824 Streptococcus B carrier state complicating childbirth: Secondary | ICD-10-CM | POA: Diagnosis present

## 2018-06-23 DIAGNOSIS — Z98891 History of uterine scar from previous surgery: Secondary | ICD-10-CM

## 2018-06-23 LAB — CBC
HCT: 35.9 % — ABNORMAL LOW (ref 36.0–46.0)
Hemoglobin: 11.7 g/dL — ABNORMAL LOW (ref 12.0–15.0)
MCH: 28.7 pg (ref 26.0–34.0)
MCHC: 32.6 g/dL (ref 30.0–36.0)
MCV: 88.2 fL (ref 80.0–100.0)
Platelets: 259 10*3/uL (ref 150–400)
RBC: 4.07 MIL/uL (ref 3.87–5.11)
RDW: 14.3 % (ref 11.5–15.5)
WBC: 8.8 10*3/uL (ref 4.0–10.5)
nRBC: 0 % (ref 0.0–0.2)

## 2018-06-23 LAB — RPR: RPR Ser Ql: NONREACTIVE

## 2018-06-23 LAB — TYPE AND SCREEN
ABO/RH(D): AB POS
Antibody Screen: NEGATIVE

## 2018-06-23 MED ORDER — SODIUM CHLORIDE 0.9 % IV SOLN
5.0000 10*6.[IU] | Freq: Once | INTRAVENOUS | Status: AC
  Administered 2018-06-23: 5 10*6.[IU] via INTRAVENOUS
  Filled 2018-06-23: qty 5

## 2018-06-23 MED ORDER — EPHEDRINE 5 MG/ML INJ: 10.0000 mg | INTRAVENOUS | Status: DC | PRN

## 2018-06-23 MED ORDER — SOD CITRATE-CITRIC ACID 500-334 MG/5ML PO SOLN
30.0000 mL | ORAL | Status: DC | PRN
  Administered 2018-06-25: 30 mL via ORAL
  Filled 2018-06-23: qty 15

## 2018-06-23 MED ORDER — PHENYLEPHRINE 40 MCG/ML (10ML) SYRINGE FOR IV PUSH (FOR BLOOD PRESSURE SUPPORT)
80.0000 ug | PREFILLED_SYRINGE | INTRAVENOUS | Status: DC | PRN
  Filled 2018-06-23: qty 10

## 2018-06-23 MED ORDER — OXYTOCIN 40 UNITS IN NORMAL SALINE INFUSION - SIMPLE MED
1.0000 m[IU]/min | INTRAVENOUS | Status: DC
  Administered 2018-06-23: 1 m[IU]/min via INTRAVENOUS
  Filled 2018-06-23 (×2): qty 1000

## 2018-06-23 MED ORDER — FENTANYL 2.5 MCG/ML BUPIVACAINE 1/10 % EPIDURAL INFUSION (WH - ANES)
14.0000 mL/h | INTRAMUSCULAR | Status: DC | PRN
  Administered 2018-06-24 – 2018-06-25 (×4): 14 mL/h via EPIDURAL
  Filled 2018-06-23 (×4): qty 100

## 2018-06-23 MED ORDER — OXYTOCIN 40 UNITS IN NORMAL SALINE INFUSION - SIMPLE MED: 2.5000 [IU]/h | INTRAVENOUS | Status: DC

## 2018-06-23 MED ORDER — ZOLPIDEM TARTRATE 5 MG PO TABS
5.0000 mg | ORAL_TABLET | Freq: Every evening | ORAL | Status: DC | PRN
  Administered 2018-06-23 – 2018-06-24 (×2): 5 mg via ORAL
  Filled 2018-06-23 (×2): qty 1

## 2018-06-23 MED ORDER — LACTATED RINGERS IV SOLN
INTRAVENOUS | Status: DC
  Administered 2018-06-23: 125 mL/h via INTRAVENOUS
  Administered 2018-06-23 – 2018-06-25 (×9): via INTRAVENOUS

## 2018-06-23 MED ORDER — DIPHENHYDRAMINE HCL 50 MG/ML IJ SOLN: 12.5000 mg | INTRAMUSCULAR | Status: DC | PRN

## 2018-06-23 MED ORDER — OXYTOCIN BOLUS FROM INFUSION: 500.0000 mL | Freq: Once | INTRAVENOUS | Status: DC

## 2018-06-23 MED ORDER — ONDANSETRON HCL 4 MG/2ML IJ SOLN
4.0000 mg | Freq: Four times a day (QID) | INTRAMUSCULAR | Status: DC | PRN
  Administered 2018-06-24 (×3): 4 mg via INTRAVENOUS
  Filled 2018-06-23 (×3): qty 2

## 2018-06-23 MED ORDER — PHENYLEPHRINE 40 MCG/ML (10ML) SYRINGE FOR IV PUSH (FOR BLOOD PRESSURE SUPPORT): 80.0000 ug | PREFILLED_SYRINGE | INTRAVENOUS | Status: DC | PRN

## 2018-06-23 MED ORDER — PENICILLIN G 3 MILLION UNITS IVPB - SIMPLE MED
3.0000 10*6.[IU] | INTRAVENOUS | Status: DC
  Administered 2018-06-23 – 2018-06-25 (×12): 3 10*6.[IU] via INTRAVENOUS
  Filled 2018-06-23 (×8): qty 100

## 2018-06-23 MED ORDER — LACTATED RINGERS IV SOLN
500.0000 mL | Freq: Once | INTRAVENOUS | Status: AC
  Administered 2018-06-23: 500 mL via INTRAVENOUS

## 2018-06-23 MED ORDER — LACTATED RINGERS IV SOLN
500.0000 mL | INTRAVENOUS | Status: DC | PRN
  Administered 2018-06-24: 500 mL via INTRAVENOUS

## 2018-06-23 MED ORDER — OXYCODONE-ACETAMINOPHEN 5-325 MG PO TABS: 2.0000 | ORAL_TABLET | ORAL | Status: DC | PRN

## 2018-06-23 MED ORDER — FENTANYL CITRATE (PF) 100 MCG/2ML IJ SOLN
50.0000 ug | INTRAMUSCULAR | Status: DC | PRN
  Administered 2018-06-23: 50 ug via INTRAVENOUS
  Administered 2018-06-24 (×2): 100 ug via INTRAVENOUS
  Filled 2018-06-23 (×3): qty 2

## 2018-06-23 MED ORDER — FLEET ENEMA 7-19 GM/118ML RE ENEM: 1.0000 | ENEMA | RECTAL | Status: DC | PRN

## 2018-06-23 MED ORDER — LIDOCAINE HCL (PF) 1 % IJ SOLN: 30.0000 mL | INTRAMUSCULAR | Status: DC | PRN

## 2018-06-23 MED ORDER — ACETAMINOPHEN 325 MG PO TABS: 650.0000 mg | ORAL_TABLET | ORAL | Status: DC | PRN

## 2018-06-23 MED ORDER — OXYCODONE-ACETAMINOPHEN 5-325 MG PO TABS: 1.0000 | ORAL_TABLET | ORAL | Status: DC | PRN

## 2018-06-23 MED ORDER — TERBUTALINE SULFATE 1 MG/ML IJ SOLN: 0.2500 mg | Freq: Once | INTRAMUSCULAR | Status: DC | PRN

## 2018-06-23 NOTE — Anesthesia Pain Management Evaluation Note (Signed)
  CRNA Pain Management Visit Note  Patient: Samantha Mora, 33 y.o., female  "Hello I am a member of the anesthesia team at [facility]. We have an anesthesia team available at all times to provide care throughout the hospital, including epidural management and anesthesia for C-section. I don't know your plan for the delivery whether it a natural birth, water birth, IV sedation, nitrous supplementation, doula or epidural, but we want to meet your pain goals."   1.Was your pain managed to your expectations on prior hospitalizations?   Yes   2.What is your expectation for pain management during this hospitalization?     Epidural  3.How can we help you reach that goal? epidural  Record the patient's initial score and the patient's pain goal.   Pain: 1  Pain Goal: 5 [facility] wants you to be able to say your pain was always managed very well.  Samantha Mora 06/23/2018

## 2018-06-23 NOTE — Progress Notes (Signed)
SVE cl/th/hi FHT Cat I Continue pitocin  Mitchel Honour, DO

## 2018-06-23 NOTE — H&P (Addendum)
Samantha Mora is a 33 y.o. female presenting for IOL/TOLAC.  Elective primary C/S previously.  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  She has been followed by [facility] and has been cleared for normal labor and delivery; will manage postpartum.  GBS positive.  Last u/s 1/21 efw 8#1 (91%).    OB History    Gravida  2   Para  1   Term  1   Preterm      AB      Living  1     SAB      TAB      Ectopic      Multiple  0   Live Births  1          Past Medical History:  Diagnosis Date  . Medical history non-contributory    Past Surgical History:  Procedure Laterality Date  . ANTERIOR CRUCIATE LIGAMENT REPAIR    . CESAREAN SECTION N/A 10/08/2016   Procedure: CESAREAN SECTION;  Surgeon: Mitchel Honour, DO;  [facility]: [facility];  Service: Obstetrics;  Laterality: N/A;  NEED RNFA  - NO RNFA AVAILABLE FROM OB OR [facility] 10/15/16 NKDA   . TONSILLECTOMY AND ADENOIDECTOMY    . TYMPANOSTOMY TUBE PLACEMENT    . WRIST SURGERY     Family History: family history includes Anemia in her paternal aunt and paternal grandmother; Cancer in her maternal grandmother and paternal grandmother; Diabetes in her paternal grandfather; Heart disease in her father, maternal grandmother, and paternal grandfather; Hypertension in her father. Social History:  reports that she has never smoked. She has never used smokeless tobacco. She reports that she does not drink alcohol or use drugs.     Maternal Diabetes: No Genetic Screening: Normal Maternal Ultrasounds/Referrals: Normal Fetal Ultrasounds or other Referrals:  None Maternal Substance Abuse:  No Significant Maternal Medications:  None Significant Maternal Lab Results:  Lab values include: Group B Strep positive Other Comments:  None  ROS Maternal Medical History:  Contractions: Onset was 6-12 hours ago.   Frequency: rare.   Perceived severity is mild.     Fetal activity: Perceived fetal activity is normal.   Last perceived fetal movement was within the past hour.    Prenatal complications: no prenatal complications Prenatal Complications - Diabetes: none.    Dilation: Closed Effacement (%): Thick Exam by:: Kimarion Chery Blood pressure 126/76, pulse 91, temperature 98.2 F (36.8 C), temperature source Oral, resp. rate 16, height 5\' 1"  (1.549 m), weight 87.7 kg, last menstrual period 09/23/2017, unknown if currently breastfeeding. Maternal Exam:  Uterine Assessment: Contraction strength is mild.  Contraction frequency is rare.   Abdomen: Patient reports no abdominal tenderness. Surgical scars: low transverse.   Fundal height is c/w dates.   Estimated fetal weight is 8#4.   Fetal presentation: vertex  Introitus: Normal vulva. Cervix: Cervix evaluated by digital exam.     Physical Exam  Constitutional: She is oriented to person, place, and time. She appears well-developed and well-nourished.  GI: Soft. There is no rebound and no guarding.  Genitourinary:    Vulva normal.   Neurological: She is alert and oriented to person, place, and time.  Skin: Skin is warm and dry.  Psychiatric: She has a normal mood and affect. Her behavior is normal.    Prenatal labs: ABO, Rh: --/--/AB POS (01/31 0115) Antibody: NEG (01/31 0115) Rubella: Immune (06/27 0000) RPR: Nonreactive (06/27 0000)  HBsAg: Negative (06/27  0000)  HIV: Non-reactive (06/27 0000)  GBS: Positive (01/07 0000)   Assessment/Plan: 32yo G2P1001 at 39 weeks for IOL/TOLAC -Continue pitocin -Patient signed TOLAC consent -Epidural when desired  Mitchel Honour 06/23/2018, 8:15 AM

## 2018-06-24 ENCOUNTER — Inpatient Hospital Stay (HOSPITAL_COMMUNITY): Payer: BLUE CROSS/BLUE SHIELD | Admitting: Anesthesiology

## 2018-06-24 MED ORDER — LACTATED RINGERS IV SOLN: 500.0000 mL | Freq: Once | INTRAVENOUS | Status: DC

## 2018-06-24 MED ORDER — LIDOCAINE HCL (PF) 1 % IJ SOLN
INTRAMUSCULAR | Status: DC | PRN
  Administered 2018-06-24: 5 mL via EPIDURAL

## 2018-06-24 MED ORDER — FENTANYL 2.5 MCG/ML BUPIVACAINE 1/10 % EPIDURAL INFUSION (WH - ANES): 14.0000 mL/h | INTRAMUSCULAR | Status: DC | PRN

## 2018-06-24 MED ORDER — EPHEDRINE 5 MG/ML INJ: 10.0000 mg | INTRAVENOUS | Status: DC | PRN

## 2018-06-24 MED ORDER — PHENYLEPHRINE 40 MCG/ML (10ML) SYRINGE FOR IV PUSH (FOR BLOOD PRESSURE SUPPORT): 80.0000 ug | PREFILLED_SYRINGE | INTRAVENOUS | Status: DC | PRN

## 2018-06-24 MED ORDER — OXYTOCIN 40 UNITS IN NORMAL SALINE INFUSION - SIMPLE MED
1.0000 m[IU]/min | INTRAVENOUS | Status: DC
  Administered 2018-06-24: 10 m[IU]/min via INTRAVENOUS

## 2018-06-24 NOTE — Progress Notes (Signed)
Dr. Langston Masker notified that pt has been on 17mu pitocin for a while & has now used entire 1000cc bag of pitocin.  Foley bulb traction applied & tail retaped at lower position.  MD ordered for pitocin to be stopped x 1 hr then restarted.

## 2018-06-24 NOTE — Progress Notes (Signed)
Patient feeling CTX more intensely over last hour.  SVE cl/th/hi (fingertip external os). Unable to place intracx foley. Patient wishes to get CLEA at this time.    Mitchel Honour, DO

## 2018-06-24 NOTE — Progress Notes (Signed)
Pitocin off for rest x 1 hour.  Resumed at 36mU; to increase 1 x 1.   FB out.   SVE 3/50/-3, AROM for clear fluid. New labial edema noted. Continue pitocin.  Mitchel Honour, DO

## 2018-06-24 NOTE — Progress Notes (Signed)
Samantha Mora is a 33 y.o. G2P1001 at [redacted]w[redacted]d by ultrasound admitted for induction of labor due to Elective at term.  Subjective: Comfortable with epidural.  Objective: BP 117/68   Pulse 73   Temp 97.9 F (36.6 C) (Oral)   Resp 18   Ht 5\' 1"  (1.549 m)   Wt 87.7 kg   LMP 09/23/2017   SpO2 100%   BMI 36.52 kg/m  No intake/output data recorded. No intake/output data recorded.  FHT:  FHR: 140 bpm, variability: moderate,  accelerations:  Present,  decelerations:  Absent UC:   regular, every 2-3 minutes SVE:   Dilation: 1 Effacement (%): Thick Exam by:: Dr. Langston Masker   Labs: Lab Results  Component Value Date   WBC 8.8 06/23/2018   HGB 11.7 (L) 06/23/2018   HCT 35.9 (L) 06/23/2018   MCV 88.2 06/23/2018   PLT 259 06/23/2018    Assessment / Plan: Induction of labor due to elective,  progressing well on pitocin  Labor: Progressing on Pitocin, will continue to increase then AROM and Foley bulb max of 12 h Preeclampsia:  n/a Fetal Wellbeing:  Category I Pain Control:  Epidural I/D:  n/a Anticipated MOD:  NSVD  Samantha Mora 06/24/2018, 6:50 AM

## 2018-06-24 NOTE — Anesthesia Procedure Notes (Signed)
Epidural Patient location during procedure: OB Start time: 06/24/2018 5:03 AM End time: 06/24/2018 5:18 AM  Staffing Anesthesiologist: Trevor Iha, MD Performed: anesthesiologist   Preanesthetic Checklist Completed: patient identified, site marked, surgical consent, pre-op evaluation, timeout performed, IV checked, risks and benefits discussed and monitors and equipment checked  Epidural Patient position: sitting Prep: site prepped and draped and DuraPrep Patient monitoring: continuous pulse ox and blood pressure Approach: midline Location: L3-L4 Injection technique: LOR air  Needle:  Needle type: Tuohy  Needle gauge: 17 G Needle length: 9 cm and 9 Needle insertion depth: 6 cm Catheter type: closed end flexible Catheter size: 19 Gauge Catheter at skin depth: 11 cm Test dose: negative  Assessment Events: blood not aspirated, injection not painful, no injection resistance, negative IV test and no paresthesia  Additional Notes Patient identified. Risks/Benefits/Options discussed with patient including but not limited to bleeding, infection, nerve damage, paralysis, failed block, incomplete pain control, headache, blood pressure changes, nausea, vomiting, reactions to medication both or allergic, itching and postpartum back pain. Confirmed with bedside nurse the patient's most recent platelet count. Confirmed with patient that they are not currently taking any anticoagulation, have any bleeding history or any family history of bleeding disorders. Patient expressed understanding and wished to proceed. All questions were answered. Sterile technique was used throughout the entire procedure. Please see nursing notes for vital signs. Test dose was given through epidural needle and negative prior to continuing to dose epidural or start infusion. Warning signs of high block given to the patient including shortness of breath, tingling/numbness in hands, complete motor block, or any concerning  symptoms with instructions to call for help. Patient was given instructions on fall risk and not to get out of bed. All questions and concerns addressed with instructions to call with any issues. 1 Attempt (S) . Patient tolerated procedure well.

## 2018-06-24 NOTE — Anesthesia Preprocedure Evaluation (Signed)
Anesthesia Evaluation  Patient identified by MRN, date of birth, ID band Patient awake    Reviewed: Allergy & Precautions, NPO status , Patient's Chart, lab work & pertinent test results  Airway Mallampati: II  TM Distance: >3 FB Neck ROM: Full    Dental no notable dental hx. (+) Teeth Intact   Pulmonary neg pulmonary ROS,    Pulmonary exam normal breath sounds clear to auscultation       Cardiovascular Exercise Tolerance: Good negative cardio ROS Normal cardiovascular exam Rhythm:Regular Rate:Normal     Neuro/Psych Hx of Osteoma Behind R eye w this pregnacy and visaul disturbances negative psych ROS   GI/Hepatic   Endo/Other    Renal/GU      Musculoskeletal negative musculoskeletal ROS (+)   Abdominal (+) + obese,   Peds  Hematology Hgb 11.7 Plts 259   Anesthesia Other Findings   Reproductive/Obstetrics (+) Pregnancy Prior C/S                             Anesthesia Physical Anesthesia Plan  ASA: III  Anesthesia Plan: Epidural   Post-op Pain Management:    Induction:   PONV Risk Score and Plan:   Airway Management Planned:   Additional Equipment:   Intra-op Plan:   Post-operative Plan:   Informed Consent: I have reviewed the patients History and Physical, chart, labs and discussed the procedure including the risks, benefits and alternatives for the proposed anesthesia with the patient or authorized representative who has indicated his/her understanding and acceptance.       Plan Discussed with:   Anesthesia Plan Comments: (Pt w history of c/s for [facility] )        Anesthesia Quick Evaluation

## 2018-06-24 NOTE — Progress Notes (Signed)
Comfortable with CLEA. SVE unchanged at 3/50/-3.  IUPC placed and will continue to increase pitocin prn. FHT Cat I Patient strongly desires VBAC.  Mitchel Honour, DO

## 2018-06-25 ENCOUNTER — Encounter (HOSPITAL_COMMUNITY): Payer: Self-pay

## 2018-06-25 ENCOUNTER — Encounter (HOSPITAL_COMMUNITY)
Admission: RE | Disposition: A | Discharge status: Home / Self Care | Payer: Self-pay | Source: Home / Self Care | Attending: Obstetrics & Gynecology

## 2018-06-25 SURGERY — Surgical Case
Anesthesia: Epidural

## 2018-06-25 MED ORDER — ONDANSETRON HCL 4 MG/2ML IJ SOLN
INTRAMUSCULAR | Status: AC
  Filled 2018-06-25: qty 2

## 2018-06-25 MED ORDER — DIPHENHYDRAMINE HCL 25 MG PO CAPS: 25.0000 mg | ORAL_CAPSULE | ORAL | Status: DC | PRN

## 2018-06-25 MED ORDER — SIMETHICONE 80 MG PO CHEW
80.0000 mg | CHEWABLE_TABLET | Freq: Three times a day (TID) | ORAL | Status: DC
  Administered 2018-06-25 – 2018-06-26 (×3): 80 mg via ORAL
  Filled 2018-06-25 (×4): qty 1

## 2018-06-25 MED ORDER — DIPHENHYDRAMINE HCL 50 MG/ML IJ SOLN
INTRAMUSCULAR | Status: DC | PRN
  Administered 2018-06-25: 12.5 mg via INTRAVENOUS

## 2018-06-25 MED ORDER — DIBUCAINE 1 % RE OINT: 1.0000 "application " | TOPICAL_OINTMENT | RECTAL | Status: DC | PRN

## 2018-06-25 MED ORDER — OXYTOCIN 10 UNIT/ML IJ SOLN
INTRAMUSCULAR | Status: AC
  Filled 2018-06-25: qty 4

## 2018-06-25 MED ORDER — NALBUPHINE HCL 10 MG/ML IJ SOLN: 5.0000 mg | INTRAMUSCULAR | Status: DC | PRN

## 2018-06-25 MED ORDER — TETANUS-DIPHTH-ACELL PERTUSSIS 5-2.5-18.5 LF-MCG/0.5 IM SUSP: 0.5000 mL | Freq: Once | INTRAMUSCULAR | Status: DC

## 2018-06-25 MED ORDER — LIDOCAINE-EPINEPHRINE (PF) 2 %-1:200000 IJ SOLN
INTRAMUSCULAR | Status: DC | PRN
  Administered 2018-06-25 (×2): 5 mL via EPIDURAL

## 2018-06-25 MED ORDER — SIMETHICONE 80 MG PO CHEW: 80.0000 mg | CHEWABLE_TABLET | ORAL | Status: DC | PRN

## 2018-06-25 MED ORDER — CEFAZOLIN SODIUM-DEXTROSE 2-3 GM-%(50ML) IV SOLR
INTRAVENOUS | Status: DC | PRN
  Administered 2018-06-25: 2 g via INTRAVENOUS

## 2018-06-25 MED ORDER — DEXAMETHASONE SODIUM PHOSPHATE 4 MG/ML IJ SOLN
INTRAMUSCULAR | Status: DC | PRN
  Administered 2018-06-25: 4 mg via INTRAVENOUS

## 2018-06-25 MED ORDER — KETOROLAC TROMETHAMINE 30 MG/ML IJ SOLN: 30.0000 mg | Freq: Four times a day (QID) | INTRAMUSCULAR | Status: AC | PRN

## 2018-06-25 MED ORDER — METOCLOPRAMIDE HCL 5 MG/ML IJ SOLN
INTRAMUSCULAR | Status: AC
  Filled 2018-06-25: qty 2

## 2018-06-25 MED ORDER — LIDOCAINE-EPINEPHRINE (PF) 2 %-1:200000 IJ SOLN
INTRAMUSCULAR | Status: AC
  Filled 2018-06-25: qty 20

## 2018-06-25 MED ORDER — SENNOSIDES-DOCUSATE SODIUM 8.6-50 MG PO TABS
2.0000 | ORAL_TABLET | ORAL | Status: DC
  Administered 2018-06-26: 2 via ORAL
  Filled 2018-06-25: qty 2

## 2018-06-25 MED ORDER — DIPHENHYDRAMINE HCL 50 MG/ML IJ SOLN: 12.5000 mg | INTRAMUSCULAR | Status: DC | PRN

## 2018-06-25 MED ORDER — OXYTOCIN 10 UNIT/ML IJ SOLN
INTRAVENOUS | Status: DC | PRN
  Administered 2018-06-25: 40 [IU] via INTRAVENOUS

## 2018-06-25 MED ORDER — MENTHOL 3 MG MT LOZG: 1.0000 | LOZENGE | OROMUCOSAL | Status: DC | PRN

## 2018-06-25 MED ORDER — DIPHENHYDRAMINE HCL 25 MG PO CAPS: 25.0000 mg | ORAL_CAPSULE | Freq: Four times a day (QID) | ORAL | Status: DC | PRN

## 2018-06-25 MED ORDER — SIMETHICONE 80 MG PO CHEW
80.0000 mg | CHEWABLE_TABLET | ORAL | Status: DC
  Administered 2018-06-25 – 2018-06-26 (×2): 80 mg via ORAL
  Filled 2018-06-25: qty 1

## 2018-06-25 MED ORDER — PROPOFOL 10 MG/ML IV BOLUS
INTRAVENOUS | Status: AC
  Filled 2018-06-25: qty 20

## 2018-06-25 MED ORDER — ONDANSETRON HCL 4 MG/2ML IJ SOLN
INTRAMUSCULAR | Status: DC | PRN
  Administered 2018-06-25: 4 mg via INTRAVENOUS

## 2018-06-25 MED ORDER — METOCLOPRAMIDE HCL 5 MG/ML IJ SOLN
INTRAMUSCULAR | Status: DC | PRN
  Administered 2018-06-25: 10 mg via INTRAVENOUS

## 2018-06-25 MED ORDER — FENTANYL CITRATE (PF) 100 MCG/2ML IJ SOLN
INTRAMUSCULAR | Status: DC | PRN
  Administered 2018-06-25: 50 ug via EPIDURAL

## 2018-06-25 MED ORDER — IBUPROFEN 800 MG PO TABS
800.0000 mg | ORAL_TABLET | Freq: Four times a day (QID) | ORAL | Status: DC
  Administered 2018-06-25 – 2018-06-26 (×5): 800 mg via ORAL
  Filled 2018-06-25 (×5): qty 1

## 2018-06-25 MED ORDER — COCONUT OIL OIL: 1.0000 "application " | TOPICAL_OIL | Status: DC | PRN

## 2018-06-25 MED ORDER — PHENYLEPHRINE 40 MCG/ML (10ML) SYRINGE FOR IV PUSH (FOR BLOOD PRESSURE SUPPORT)
PREFILLED_SYRINGE | INTRAVENOUS | Status: AC
  Filled 2018-06-25: qty 10

## 2018-06-25 MED ORDER — HYDROMORPHONE HCL 1 MG/ML IJ SOLN: 0.2500 mg | INTRAMUSCULAR | Status: DC | PRN

## 2018-06-25 MED ORDER — SCOPOLAMINE 1 MG/3DAYS TD PT72
1.0000 | MEDICATED_PATCH | Freq: Once | TRANSDERMAL | Status: DC
  Filled 2018-06-25: qty 1

## 2018-06-25 MED ORDER — NALOXONE HCL 4 MG/10ML IJ SOLN
1.0000 ug/kg/h | INTRAVENOUS | Status: DC | PRN
  Filled 2018-06-25: qty 5

## 2018-06-25 MED ORDER — SCOPOLAMINE 1 MG/3DAYS TD PT72
MEDICATED_PATCH | TRANSDERMAL | Status: AC
  Filled 2018-06-25: qty 1

## 2018-06-25 MED ORDER — MORPHINE SULFATE (PF) 0.5 MG/ML IJ SOLN
INTRAMUSCULAR | Status: DC | PRN
  Administered 2018-06-25: 3 mg via EPIDURAL

## 2018-06-25 MED ORDER — NALBUPHINE HCL 10 MG/ML IJ SOLN: 5.0000 mg | Freq: Once | INTRAMUSCULAR | Status: DC | PRN

## 2018-06-25 MED ORDER — ZOLPIDEM TARTRATE 5 MG PO TABS: 5.0000 mg | ORAL_TABLET | Freq: Every evening | ORAL | Status: DC | PRN

## 2018-06-25 MED ORDER — HYDROCODONE-ACETAMINOPHEN 7.5-325 MG PO TABS: 1.0000 | ORAL_TABLET | Freq: Once | ORAL | Status: DC | PRN

## 2018-06-25 MED ORDER — MEPERIDINE HCL 25 MG/ML IJ SOLN: 6.2500 mg | INTRAMUSCULAR | Status: DC | PRN

## 2018-06-25 MED ORDER — PRENATAL MULTIVITAMIN CH
1.0000 | ORAL_TABLET | Freq: Every day | ORAL | Status: DC
  Administered 2018-06-25 – 2018-06-26 (×2): 1 via ORAL
  Filled 2018-06-25 (×2): qty 1

## 2018-06-25 MED ORDER — CEFAZOLIN SODIUM-DEXTROSE 2-4 GM/100ML-% IV SOLN
2.0000 g | Freq: Once | INTRAVENOUS | Status: DC
  Filled 2018-06-25: qty 100

## 2018-06-25 MED ORDER — ONDANSETRON HCL 4 MG/2ML IJ SOLN: 4.0000 mg | Freq: Once | INTRAMUSCULAR | Status: DC | PRN

## 2018-06-25 MED ORDER — DEXAMETHASONE SODIUM PHOSPHATE 4 MG/ML IJ SOLN
INTRAMUSCULAR | Status: AC
  Filled 2018-06-25: qty 1

## 2018-06-25 MED ORDER — NALOXONE HCL 0.4 MG/ML IJ SOLN: 0.4000 mg | INTRAMUSCULAR | Status: DC | PRN

## 2018-06-25 MED ORDER — DIPHENHYDRAMINE HCL 50 MG/ML IJ SOLN
INTRAMUSCULAR | Status: AC
  Filled 2018-06-25: qty 1

## 2018-06-25 MED ORDER — MORPHINE SULFATE (PF) 0.5 MG/ML IJ SOLN
INTRAMUSCULAR | Status: AC
  Filled 2018-06-25: qty 10

## 2018-06-25 MED ORDER — SODIUM CHLORIDE 0.9% FLUSH: 3.0000 mL | INTRAVENOUS | Status: DC | PRN

## 2018-06-25 MED ORDER — OXYTOCIN 40 UNITS IN NORMAL SALINE INFUSION - SIMPLE MED
2.5000 [IU]/h | INTRAVENOUS | Status: AC
  Administered 2018-06-25: 2.5 [IU]/h via INTRAVENOUS

## 2018-06-25 MED ORDER — SCOPOLAMINE 1 MG/3DAYS TD PT72
MEDICATED_PATCH | TRANSDERMAL | Status: DC | PRN
  Administered 2018-06-25: 1 via TRANSDERMAL

## 2018-06-25 MED ORDER — OXYCODONE-ACETAMINOPHEN 5-325 MG PO TABS
1.0000 | ORAL_TABLET | ORAL | Status: DC | PRN
  Administered 2018-06-26: 1 via ORAL
  Filled 2018-06-25: qty 1

## 2018-06-25 MED ORDER — ONDANSETRON HCL 4 MG/2ML IJ SOLN
4.0000 mg | Freq: Three times a day (TID) | INTRAMUSCULAR | Status: DC | PRN
  Administered 2018-06-25: 4 mg via INTRAVENOUS
  Filled 2018-06-25: qty 2

## 2018-06-25 MED ORDER — PROPOFOL 10 MG/ML IV BOLUS
INTRAVENOUS | Status: DC | PRN
  Administered 2018-06-25 (×2): 10 mg via INTRAVENOUS

## 2018-06-25 MED ORDER — FENTANYL CITRATE (PF) 100 MCG/2ML IJ SOLN
INTRAMUSCULAR | Status: AC
  Filled 2018-06-25: qty 2

## 2018-06-25 MED ORDER — ACETAMINOPHEN 10 MG/ML IV SOLN: 1000.0000 mg | Freq: Once | INTRAVENOUS | Status: DC | PRN

## 2018-06-25 MED ORDER — LACTATED RINGERS IV SOLN
INTRAVENOUS | Status: DC | PRN
  Administered 2018-06-25: 08:00:00 via INTRAVENOUS

## 2018-06-25 MED ORDER — LACTATED RINGERS IV SOLN
INTRAVENOUS | Status: DC
  Administered 2018-06-25: 16:00:00 via INTRAVENOUS

## 2018-06-25 MED ORDER — WITCH HAZEL-GLYCERIN EX PADS: 1.0000 "application " | MEDICATED_PAD | CUTANEOUS | Status: DC | PRN

## 2018-06-25 SURGICAL SUPPLY — 37 items
BENZOIN TINCTURE PRP APPL 2/3 (GAUZE/BANDAGES/DRESSINGS) ×3 IMPLANT
CHLORAPREP W/TINT 26ML (MISCELLANEOUS) ×3 IMPLANT
CLAMP CORD UMBIL (MISCELLANEOUS) IMPLANT
CLOSURE STERI STRIP 1/2 X4 (GAUZE/BANDAGES/DRESSINGS) ×3 IMPLANT
CLOSURE WOUND 1/2 X4 (GAUZE/BANDAGES/DRESSINGS)
CLOTH BEACON ORANGE TIMEOUT ST (SAFETY) ×3 IMPLANT
DERMABOND ADVANCED (GAUZE/BANDAGES/DRESSINGS)
DERMABOND ADVANCED .7 DNX12 (GAUZE/BANDAGES/DRESSINGS) IMPLANT
DRSG OPSITE POSTOP 4X10 (GAUZE/BANDAGES/DRESSINGS) ×3 IMPLANT
ELECT REM PT RETURN 9FT ADLT (ELECTROSURGICAL) ×3
ELECTRODE REM PT RTRN 9FT ADLT (ELECTROSURGICAL) ×1 IMPLANT
EXTRACTOR VACUUM KIWI (MISCELLANEOUS) IMPLANT
GLOVE BIO SURGEON STRL SZ 6 (GLOVE) ×3 IMPLANT
GLOVE BIOGEL PI IND STRL 6 (GLOVE) ×2 IMPLANT
GLOVE BIOGEL PI IND STRL 7.0 (GLOVE) ×1 IMPLANT
GLOVE BIOGEL PI INDICATOR 6 (GLOVE) ×4
GLOVE BIOGEL PI INDICATOR 7.0 (GLOVE) ×2
GOWN STRL REUS W/TWL LRG LVL3 (GOWN DISPOSABLE) ×6 IMPLANT
KIT ABG SYR 3ML LUER SLIP (SYRINGE) ×3 IMPLANT
NEEDLE HYPO 25X5/8 SAFETYGLIDE (NEEDLE) ×3 IMPLANT
NS IRRIG 1000ML POUR BTL (IV SOLUTION) ×3 IMPLANT
PACK C SECTION WH (CUSTOM PROCEDURE TRAY) ×3 IMPLANT
PAD OB MATERNITY 4.3X12.25 (PERSONAL CARE ITEMS) ×3 IMPLANT
PENCIL SMOKE EVAC W/HOLSTER (ELECTROSURGICAL) ×3 IMPLANT
STRIP CLOSURE SKIN 1/2X4 (GAUZE/BANDAGES/DRESSINGS) IMPLANT
SUT CHROMIC 0 CTX 36 (SUTURE) ×9 IMPLANT
SUT MON AB 2-0 CT1 27 (SUTURE) ×3 IMPLANT
SUT PDS AB 0 CT1 27 (SUTURE) IMPLANT
SUT PDS AB 0 CTX 36 PDP370T (SUTURE) ×3 IMPLANT
SUT PDS AB 0 CTX 60 (SUTURE) ×3 IMPLANT
SUT PLAIN 0 NONE (SUTURE) IMPLANT
SUT VIC AB 0 CT1 36 (SUTURE) IMPLANT
SUT VIC AB 4-0 KS 27 (SUTURE) IMPLANT
TOWEL OR 17X24 6PK STRL BLUE (TOWEL DISPOSABLE) ×3 IMPLANT
TRAY FOLEY W/BAG SLVR 14FR LF (SET/KITS/TRAYS/PACK) IMPLANT
WATER STERILE IRR 1000ML POUR (IV SOLUTION) ×3 IMPLANT
YANKAUER SUCT BULB TIP NO VENT (SUCTIONS) ×3 IMPLANT

## 2018-06-25 NOTE — Progress Notes (Signed)
CVX 3/50/-3 with caput after inability to get adequate MVUs up to 29 mU pitocin.  Patient counseled re: my recommendation to proceed with repeat C/S for arrest of dilation.  Patient is informed of risk of bleeding, infection, scarring, and damage to surrounding structures.  All questions were answered and the patient wishes to proceed.    Mitchel Honour, DO

## 2018-06-25 NOTE — Lactation Note (Signed)
This note was copied from a baby's chart. Lactation Consultation Note  Patient Name: Samantha Mora MYTRZ'N Date: 06/25/2018 Reason for consult: Initial assessment;Term  70 hours old FT female who is being exclusively BF by his mother, she's a P2 but not very experienced BF. She had difficulties BF her first baby due to a frenulum (she didn't get it clipped) and even though mom pumped till baby was 73 months old; due to oversupply she had enough frozen breastmilk that lasted until baby was 39 months old; she just run out of it last month, baby # 1 is now 41 months old. When revising hand expression with mom, she showed LC how colostrum was flowing off her nipples. Mom voiced she's been leaking during the last trimester of her pregnancy and she had to pump for comfort 3 times during the last week of this pregnancy and got about 1.5 ounces per pumping session. She has a Medela DEBP at home.  Offered assistance with latch but mom politely declined, baby was asleep and swaddled in dad's arms. Per mom feedings at the breast are comfortable and both of her nipples looked intact; no signs of fullness or engorgement yet, mom's breasts were soft still. Asked mom to call for assistance when needed. Parents reported a small emesis of colostrum after the last feeding but don't recall the time. Discussed normal newborn behavior, cluster feeding, baby's sleeping cycle and pumping.   Feeding plan:  1. Encouraged mom to feed baby STS 8-12 times/24 hours or sooner if feeding cues are present 2. Hand expression and spoon feeding was also encouraged  BF brochure, BF resources and feeding diary were reviewed. Parents reported all questions and concerns were answered, they're both aware of LC services and will call PRN.  Maternal Data Formula Feeding for Exclusion: No Has patient been taught Hand Expression?: Yes Does the patient have breastfeeding experience prior to this delivery?: Yes  Feeding       Interventions Interventions: Breast feeding basics reviewed;Hand express  Lactation Tools Discussed/Used WIC Program: No   Consult Status Consult Status: Follow-up Date: 06/26/18 Follow-up type: In-patient    Lilyahna Sirmon Venetia Constable 06/25/2018, 5:32 PM

## 2018-06-25 NOTE — Progress Notes (Signed)
Dr Langston MaskerMorris notified of BP trend today.  Pt is asymptomatic for Bp concerns and all other vital signs WNL.  MD requesting to be notified of BP reaches 160+ /110+.  No other orders.

## 2018-06-25 NOTE — Progress Notes (Signed)
Morris at bedside discussing cesarean section, risks and benefits discussed, questions answered.  Consents signed.

## 2018-06-25 NOTE — Anesthesia Postprocedure Evaluation (Signed)
Anesthesia Post Note  Patient: Samantha Mora  Procedure(s) Performed: CESAREAN SECTION (N/A )     Patient location during evaluation: Mother Baby Anesthesia Type: Epidural Level of consciousness: awake and alert Pain management: pain level controlled Vital Signs Assessment: post-procedure vital signs reviewed and stable Respiratory status: spontaneous breathing, nonlabored ventilation and respiratory function stable Cardiovascular status: stable Postop Assessment: no headache, no backache, epidural receding and patient able to bend at knees Anesthetic complications: no    Last Vitals:  Vitals:   06/25/18 1256 06/25/18 1300  BP: 134/77 (!) 156/87  Pulse: 66 63  Resp: 17 19  Temp: 36.5 C 36.5 C  SpO2: 96% 99%    Last Pain:  Vitals:   06/25/18 1439  TempSrc:   PainSc: 0-No pain   Pain Goal:                   Rica Records

## 2018-06-25 NOTE — Anesthesia Postprocedure Evaluation (Signed)
Anesthesia Post Note  Patient: Samantha Mora  Procedure(s) Performed: CESAREAN SECTION (N/A )     Patient location during evaluation: Mother Baby Anesthesia Type: Epidural Level of consciousness: oriented and awake and alert Pain management: pain level controlled Vital Signs Assessment: post-procedure vital signs reviewed and stable Respiratory status: spontaneous breathing and respiratory function stable Cardiovascular status: blood pressure returned to baseline and stable Postop Assessment: no headache, no backache, no apparent nausea or vomiting and able to ambulate Anesthetic complications: no    Last Vitals:  Vitals:   06/25/18 1115 06/25/18 1256  BP: (!) 146/85 134/77  Pulse: (!) 55 66  Resp: 19 17  Temp: 36.6 C 36.5 C  SpO2:      Last Pain:  Vitals:   06/25/18 1258  TempSrc:   PainSc: 0-No pain   Pain Goal:                   Trevor IhaStephen A Houser

## 2018-06-25 NOTE — Transfer of Care (Signed)
Immediate Anesthesia Transfer of Care Note  Patient: Samantha Mora  Procedure(s) Performed: CESAREAN SECTION (N/A )  Patient Location: PACU  Anesthesia Type:Epidural  Level of Consciousness: awake, alert  and oriented  Airway & Oxygen Therapy: Patient Spontanous Breathing  Post-op Assessment: Report given to RN and Post -op Vital signs reviewed and stable  Post vital signs: Reviewed and stable  Last Vitals:  Vitals Value Taken Time  BP 130/71 06/25/2018  8:51 AM  Temp    Pulse 80 06/25/2018  8:52 AM  Resp 11 06/25/2018  8:52 AM  SpO2 99 % 06/25/2018  8:52 AM  Vitals shown include unvalidated device data.  Last Pain:  Vitals:   06/25/18 0700  TempSrc: Axillary  PainSc:          Complications: No apparent anesthesia complications

## 2018-06-25 NOTE — Op Note (Signed)
Samantha Mora PROCEDURE DATE: 06/25/2018  PREOPERATIVE DIAGNOSIS: Intrauterine pregnancy at  [redacted]w[redacted]d weeks gestation, arrest of dilation, previous C/S x 1  POSTOPERATIVE DIAGNOSIS: The same  PROCEDURE:  Repeat Low Transverse Cesarean Section  SURGEON:  Dr. Mitchel Honour  INDICATIONS: Samantha Mora is a 33 y.o. G2P1001 at [redacted]w[redacted]d scheduled for cesarean section secondary to arrest of dilation at 3 cm after >48 hours of induction; previous C/S x 1.  The risks of cesarean section discussed with the patient included but were not limited to: bleeding which may require transfusion or reoperation; infection which may require antibiotics; injury to bowel, bladder, ureters or other surrounding organs; injury to the fetus; need for additional procedures including hysterectomy in the event of a life-threatening hemorrhage; placental abnormalities wth subsequent pregnancies, incisional problems, thromboembolic phenomenon and other postoperative/anesthesia complications. The patient concurred with the proposed plan, giving informed written consent for the procedure.    FINDINGS:  Viable female infant in cephalic presentation, APGARs 9,9: weight pending  Clear amniotic fluid.  Intact placenta, three vessel cord with true know.  Grossly normal uterus, ovaries and fallopian tubes. .   ANESTHESIA:  Epidural ESTIMATED BLOOD LOSS: 400 ml SPECIMENS: Placenta sent to pathology COMPLICATIONS: None immediate  PROCEDURE IN DETAIL:  The patient received intravenous antibiotics and had sequential compression devices applied to her lower extremities while in the preoperative area.  She was then taken to the operating room where epidural anesthesia was dosed up to surgical level and was found to be adequate. She was then placed in a dorsal supine position with a leftward tilt, and prepped and draped in a sterile manner.  A foley catheter was placed into her bladder and attached to constant gravity.  After an adequate  timeout was performed, a Pfannenstiel skin incision was made with scalpel and carried through to the underlying layer of fascia. The fascia was incised in the midline and this incision was extended bilaterally using the Mayo scissors. Kocher clamps were applied to the superior aspect of the fascial incision and the underlying rectus muscles were dissected off bluntly. A similar process was carried out on the inferior aspect of the facial incision. The rectus muscles were separated in the midline bluntly and the peritoneum was entered bluntly.  Bladder flap was created sharply and developed bluntly.  Bladder blade was placed.  A transverse hysterotomy was made with a scalpel and extended bilaterally bluntly. The bladder blade was then removed. The infant was successfully delivered, and cord was clamped and cut and infant was handed over to awaiting neonatology team. Uterine massage was then administered and the placenta delivered intact with three-vessel cord. The uterus was cleared of clot and debris.  The hysterotomy was closed with 0 chromic.  A second imbricating suture of 0-chromic was used to reinforce the incision and aid in hemostasis.  The peritoneum and rectus muscles were noted to be hemostatic and were reapproximated using 2-0 monocryl in a running fashion.  The fascia was closed with 0-PDS in a running fashion with good restoration of anatomy.  The subcutaneus tissue was copiously irrigated.  The skin was closed with 4-0 vicryl in a subcuticular fashion.  Pt tolerated the procedure will.  All counts were correct x2.  Pt went to the recovery room in stable condition.

## 2018-06-25 NOTE — Addendum Note (Signed)
Addendum  created 06/25/18 1725 by Rica Records, CRNA   Clinical Note Signed

## 2018-06-26 LAB — CBC
HEMATOCRIT: 30.7 % — AB (ref 36.0–46.0)
HEMOGLOBIN: 10.3 g/dL — AB (ref 12.0–15.0)
MCH: 29.2 pg (ref 26.0–34.0)
MCHC: 33.6 g/dL (ref 30.0–36.0)
MCV: 87 fL (ref 80.0–100.0)
Platelets: 245 10*3/uL (ref 150–400)
RBC: 3.53 MIL/uL — ABNORMAL LOW (ref 3.87–5.11)
RDW: 14.4 % (ref 11.5–15.5)
WBC: 12.4 10*3/uL — AB (ref 4.0–10.5)
nRBC: 0 % (ref 0.0–0.2)

## 2018-06-26 MED ORDER — OXYCODONE HCL 5 MG PO TABS: 5.0000 mg | ORAL_TABLET | ORAL | 0 refills | Status: AC | PRN

## 2018-06-26 MED ORDER — IBUPROFEN 800 MG PO TABS: 800.0000 mg | ORAL_TABLET | Freq: Four times a day (QID) | ORAL | 0 refills | Status: AC

## 2018-06-26 MED ORDER — DOCUSATE SODIUM 100 MG PO CAPS: 100.0000 mg | ORAL_CAPSULE | Freq: Two times a day (BID) | ORAL | 2 refills | Status: AC

## 2018-06-26 NOTE — Discharge Summary (Signed)
Obstetric Discharge Summary Reason for Admission: induction of labor and strongly desired VBAC Prenatal Procedures: VTOLAC Intrapartum Procedures: cesarean: low cervical, transverse  - failed IOL s/s arrest of dilation Postpartum Procedures: none Complications-Operative and Postpartum: none Hemoglobin  Date Value Ref Range Status  06/26/2018 10.3 (L) 12.0 - 15.0 g/dL Final   HCT  Date Value Ref Range Status  06/26/2018 30.7 (L) 36.0 - 46.0 % Final   Patient request to go home POD#1 - meeting all goals including pain being controlled with PO pain meds, VF, passing flatus, and tolerating a general diet. She had elevated Bps POD#0 that have completely resolved overnight and today. Will fu for BP and incision check in the office in 1 week.    Physical Exam:  General: alert, cooperative and appears stated age Lochia: appropriate Uterine Fundus: firm Incision: healing well, no significant drainage, no dehiscence, no significant erythema DVT Evaluation: No evidence of DVT seen on physical exam. Negative Homan's sign. No cords or calf tenderness. No significant calf/ankle edema.  Discharge Diagnoses: Term Pregnancy-delivered  Discharge Information: Date: 06/26/2018 Activity: pelvic rest Diet: routine Medications: PNV, Ibuprofen, Colace and oxycodone Condition: stable Instructions: refer to practice specific booklet Discharge to: home   Newborn Data: Live born female  Birth Weight: 8 lb 11.9 oz (3965 g) APGAR: 9, 9  D/W patient female infant circumcision, risks/benefits reviewed. All questions answered.  Newborn Delivery   Birth date/time:  06/[birth year]/2018 07:58:00 Delivery type:  C-Section, Low Transverse Trial of labor:  Yes C-section categorization:  Repeat     Home with mother.  Samantha Mora 06/26/2018, 8:45 AM

## 2018-06-26 NOTE — Lactation Note (Signed)
This note was copied from a baby's chart. Lactation Consultation Note  Patient Name: Samantha Mora [id number]'s Date: 06/26/2018 Reason for consult: Follow-up assessment   Baby 27 hours old and recently was circumcised. Mother had difficulty with breastfeeding her first child due to lip tie and pumped for 10 mos with an oversupply. [facility]'s mouth for frenulum issues.  Nothing noted at this time but made aware of things to watch for in case parents have problems in the future.   Mother was able to hand express easily. Mother latched baby in cradle hold with a few swallows due to baby being sleepy after circ. Mother denies nipple soreness and stools are brownish per FOB. Feed on demand approximately 8-12 times per day.   Reviewed engorgement care and monitoring voids/stools.    Maternal Data    Feeding Feeding Type: Breast Fed  LATCH Score Latch: Grasps breast easily, tongue down, lips flanged, rhythmical sucking.  Audible Swallowing: A few with stimulation  Type of Nipple: Everted at rest and after stimulation  Comfort (Breast/Nipple): Soft / non-tender  Hold (Positioning): Assistance needed to correctly position infant at breast and maintain latch.  LATCH Score: 8  Interventions Interventions: Hand express  Lactation Tools Discussed/Used     Consult Status Consult Status: Follow-up Date: 06/27/18 Follow-up type: In-patient    Dahlia ByesBerkelhammer, Ruth [facility] 06/26/2018, 11:30 AM
